# Patient Record
Sex: Male | Born: 1940
Health system: Southern US, Community
[De-identification: ages and names within clinical notes are randomized; demographics above are authoritative.]

## PROBLEM LIST (undated history)

## (undated) DIAGNOSIS — Z8669 Personal history of other diseases of the nervous system and sense organs: Secondary | ICD-10-CM

## (undated) DIAGNOSIS — M1990 Unspecified osteoarthritis, unspecified site: Secondary | ICD-10-CM

## (undated) DIAGNOSIS — T7840XA Allergy, unspecified, initial encounter: Secondary | ICD-10-CM

## (undated) DIAGNOSIS — C801 Malignant (primary) neoplasm, unspecified: Secondary | ICD-10-CM

## (undated) DIAGNOSIS — H269 Unspecified cataract: Secondary | ICD-10-CM

## (undated) DIAGNOSIS — I10 Essential (primary) hypertension: Secondary | ICD-10-CM

## (undated) DIAGNOSIS — H409 Unspecified glaucoma: Secondary | ICD-10-CM

## (undated) HISTORY — PX: COLONOSCOPY: SHX174

## (undated) HISTORY — DX: Unspecified osteoarthritis, unspecified site: M19.90

## (undated) HISTORY — DX: Malignant (primary) neoplasm, unspecified: C80.1

## (undated) HISTORY — DX: Allergy, unspecified, initial encounter: T78.40XA

## (undated) HISTORY — DX: Personal history of other diseases of the nervous system and sense organs: Z86.69

## (undated) HISTORY — DX: Unspecified cataract: H26.9

## (undated) HISTORY — PX: OTHER SURGICAL HISTORY: SHX169

## (undated) HISTORY — DX: Essential (primary) hypertension: I10

## (undated) HISTORY — PX: POLYPECTOMY: SHX149

## (undated) HISTORY — PX: UPPER GASTROINTESTINAL ENDOSCOPY: SHX188

## (undated) HISTORY — DX: Unspecified glaucoma: H40.9

---

## 1998-08-27 HISTORY — PX: VEIN LIGATION: SHX2652

## 2006-08-27 HISTORY — PX: PANCREAS SURGERY: SHX731

## 2006-12-28 ENCOUNTER — Emergency Department (HOSPITAL_COMMUNITY): Admission: EM | Admit: 2006-12-28 | Discharge: 2006-12-28 | Payer: Self-pay | Admitting: Emergency Medicine

## 2007-01-23 ENCOUNTER — Encounter: Payer: Self-pay | Admitting: Gastroenterology

## 2007-01-23 ENCOUNTER — Ambulatory Visit (HOSPITAL_COMMUNITY): Admission: RE | Admit: 2007-01-23 | Discharge: 2007-01-23 | Payer: Self-pay | Admitting: Gastroenterology

## 2007-01-27 ENCOUNTER — Encounter: Payer: Self-pay | Admitting: Gastroenterology

## 2007-01-28 ENCOUNTER — Ambulatory Visit: Payer: Self-pay | Admitting: Gastroenterology

## 2007-02-21 ENCOUNTER — Encounter: Payer: Self-pay | Admitting: Gastroenterology

## 2007-02-26 ENCOUNTER — Encounter: Payer: Self-pay | Admitting: Gastroenterology

## 2007-03-31 ENCOUNTER — Encounter: Payer: Self-pay | Admitting: Gastroenterology

## 2008-01-08 ENCOUNTER — Encounter: Admission: RE | Admit: 2008-01-08 | Discharge: 2008-01-08 | Payer: Self-pay | Admitting: Orthopaedic Surgery

## 2008-06-08 ENCOUNTER — Ambulatory Visit: Payer: Self-pay | Admitting: Gastroenterology

## 2008-06-16 ENCOUNTER — Ambulatory Visit: Payer: Self-pay | Admitting: Gastroenterology

## 2008-06-16 ENCOUNTER — Encounter: Payer: Self-pay | Admitting: Gastroenterology

## 2008-06-17 ENCOUNTER — Encounter: Payer: Self-pay | Admitting: Gastroenterology

## 2009-01-27 ENCOUNTER — Encounter: Admission: RE | Admit: 2009-01-27 | Discharge: 2009-01-27 | Payer: Self-pay | Admitting: Internal Medicine

## 2009-10-25 DIAGNOSIS — N401 Enlarged prostate with lower urinary tract symptoms: Secondary | ICD-10-CM | POA: Insufficient documentation

## 2009-10-25 DIAGNOSIS — L309 Dermatitis, unspecified: Secondary | ICD-10-CM | POA: Insufficient documentation

## 2009-10-25 DIAGNOSIS — G43909 Migraine, unspecified, not intractable, without status migrainosus: Secondary | ICD-10-CM | POA: Insufficient documentation

## 2009-10-25 DIAGNOSIS — M1990 Unspecified osteoarthritis, unspecified site: Secondary | ICD-10-CM | POA: Insufficient documentation

## 2009-10-25 DIAGNOSIS — I10 Essential (primary) hypertension: Secondary | ICD-10-CM | POA: Insufficient documentation

## 2009-11-28 DIAGNOSIS — K869 Disease of pancreas, unspecified: Secondary | ICD-10-CM | POA: Insufficient documentation

## 2009-11-28 DIAGNOSIS — R739 Hyperglycemia, unspecified: Secondary | ICD-10-CM | POA: Insufficient documentation

## 2010-01-31 ENCOUNTER — Encounter: Admission: RE | Admit: 2010-01-31 | Discharge: 2010-01-31 | Payer: Self-pay | Admitting: Internal Medicine

## 2010-11-30 DIAGNOSIS — H3530 Unspecified macular degeneration: Secondary | ICD-10-CM | POA: Insufficient documentation

## 2011-01-26 ENCOUNTER — Other Ambulatory Visit: Payer: Self-pay | Admitting: Internal Medicine

## 2011-01-26 DIAGNOSIS — K869 Disease of pancreas, unspecified: Secondary | ICD-10-CM

## 2011-02-06 ENCOUNTER — Ambulatory Visit
Admission: RE | Admit: 2011-02-06 | Discharge: 2011-02-06 | Disposition: A | Discharge status: Home / Self Care | Payer: BC Managed Care – PPO | Source: Ambulatory Visit | Attending: Internal Medicine | Admitting: Internal Medicine

## 2011-02-06 DIAGNOSIS — K869 Disease of pancreas, unspecified: Secondary | ICD-10-CM

## 2011-02-06 MED ORDER — IOHEXOL 300 MG/ML  SOLN
100.0000 mL | Freq: Once | INTRAMUSCULAR | Status: AC | PRN
  Administered 2011-02-06: 100 mL via INTRAVENOUS

## 2011-12-05 ENCOUNTER — Other Ambulatory Visit: Payer: Self-pay | Admitting: Internal Medicine

## 2011-12-05 DIAGNOSIS — K869 Disease of pancreas, unspecified: Secondary | ICD-10-CM

## 2011-12-19 ENCOUNTER — Other Ambulatory Visit: Payer: Self-pay | Admitting: Dermatology

## 2012-02-07 ENCOUNTER — Ambulatory Visit
Admission: RE | Admit: 2012-02-07 | Discharge: 2012-02-07 | Disposition: A | Discharge status: Home / Self Care | Payer: BC Managed Care – PPO | Source: Ambulatory Visit | Attending: Internal Medicine | Admitting: Internal Medicine

## 2012-02-07 DIAGNOSIS — K869 Disease of pancreas, unspecified: Secondary | ICD-10-CM

## 2012-02-07 MED ORDER — IOHEXOL 300 MG/ML  SOLN
100.0000 mL | Freq: Once | INTRAMUSCULAR | Status: AC | PRN
  Administered 2012-02-07: 100 mL via INTRAVENOUS

## 2012-06-24 DIAGNOSIS — Z2839 Other underimmunization status: Secondary | ICD-10-CM | POA: Insufficient documentation

## 2012-07-17 ENCOUNTER — Other Ambulatory Visit: Payer: Self-pay | Admitting: Dermatology

## 2012-12-19 DIAGNOSIS — J309 Allergic rhinitis, unspecified: Secondary | ICD-10-CM | POA: Insufficient documentation

## 2013-04-14 ENCOUNTER — Encounter: Payer: Self-pay | Admitting: Gastroenterology

## 2013-06-26 ENCOUNTER — Ambulatory Visit (AMBULATORY_SURGERY_CENTER): Payer: Self-pay | Admitting: *Deleted

## 2013-06-26 VITALS — Ht 70.0 in | Wt 156.2 lb

## 2013-06-26 DIAGNOSIS — Z86010 Personal history of colonic polyps: Secondary | ICD-10-CM

## 2013-06-26 MED ORDER — MOVIPREP 100 G PO SOLR: ORAL | Status: DC

## 2013-06-26 NOTE — Progress Notes (Signed)
No allergies to eggs or soy. No problems with anesthesia.  

## 2013-06-29 ENCOUNTER — Encounter: Payer: Self-pay | Admitting: Gastroenterology

## 2013-07-10 ENCOUNTER — Encounter: Payer: Self-pay | Admitting: Gastroenterology

## 2013-07-10 ENCOUNTER — Ambulatory Visit (AMBULATORY_SURGERY_CENTER): Payer: BC Managed Care – PPO | Admitting: Gastroenterology

## 2013-07-10 VITALS — BP 146/78 | HR 56 | Temp 97.6°F | Resp 24

## 2013-07-10 DIAGNOSIS — Z800 Family history of malignant neoplasm of digestive organs: Secondary | ICD-10-CM

## 2013-07-10 DIAGNOSIS — D126 Benign neoplasm of colon, unspecified: Secondary | ICD-10-CM

## 2013-07-10 DIAGNOSIS — Z86010 Personal history of colonic polyps: Secondary | ICD-10-CM

## 2013-07-10 MED ORDER — SODIUM CHLORIDE 0.9 % IV SOLN: 500.0000 mL | INTRAVENOUS | Status: DC

## 2013-07-10 NOTE — Progress Notes (Signed)
Patient did not experience any of the following events: a burn prior to discharge; a fall within the facility; wrong site/side/patient/procedure/implant event; or a hospital transfer or hospital admission upon discharge from the facility. (G8907) Patient did not have preoperative order for IV antibiotic SSI prophylaxis. (G8918)  

## 2013-07-10 NOTE — Op Note (Signed)
[facility] [address] [place], [address]   COLONOSCOPY PROCEDURE REPORT  PATIENT: Roy Moody, Roy Moody  MR#: [id number] BIRTHDATE: 07/01/1941 , 72  yrs. old GENDER: Male ENDOSCOPIST: Rachael Fee, MD PROCEDURE DATE:  07/10/2013 PROCEDURE:   Colonoscopy with snare polypectomy First Screening Colonoscopy - Avg.  risk and is 50 yrs.  old or older - No.  Prior Negative Screening - Now for repeat screening. N/A  History of Adenoma - Now for follow-up colonoscopy & has been > or = to 3 yrs.  Yes hx of adenoma.  Has been 3 or more years since last colonoscopy.  Polyps Removed Today? Yes. ASA CLASS:   Class II INDICATIONS:personal history adenomas (last colonscopy 5 years ago), father had colon cancer. MEDICATIONS: Fentanyl 50 mcg IV, Versed 8 mg IV, and These medications were titrated to patient response per physician's verbal order  DESCRIPTION OF PROCEDURE:   After the risks benefits and alternatives of the procedure were thoroughly explained, informed consent was obtained.  A digital rectal exam revealed no abnormalities of the rectum.   The LB [id number]  endoscope was introduced through the anus and advanced to the cecum, which was identified by both the appendix and ileocecal valve. No adverse events experienced.   The quality of the prep was good.  The instrument was then slowly withdrawn as the colon was fully examined.  COLON FINDINGS: Two polyps were found, removed and sent to pathology.  These were both sessile, located at appendiceal orifice and ascending segment, 4-80mm across, removed with cold snare.  The examination was otherwise normal.  Retroflexed views revealed no abnormalities. The time to cecum=5 minutes 32 seconds.  Withdrawal time=10 minutes 26 seconds.  The scope was withdrawn and the procedure completed. COMPLICATIONS: There were no complications.  ENDOSCOPIC IMPRESSION: Two polyps were found, removed and sent to  pathology. The examination was otherwise normal.  RECOMMENDATIONS: If the polyp(s) removed today are proven to be adenomatous (pre-cancerous) polyps, you will need a repeat colonoscopy in 5 years.  You will receive a letter within 1-2 weeks with the results of your biopsy as well as final recommendations.  Please call my office if you have not received a letter after 3 weeks.   eSigned:  Rachael Fee, MD 07/10/2013 12:07 PM   cc: Creola Corn, MD

## 2013-07-10 NOTE — Patient Instructions (Signed)
Discharge instructions given with verbal understanding. Handout on polyps. Resume previous medications. YOU HAD AN ENDOSCOPIC PROCEDURE TODAY AT THE [facility]: Refer to the procedure report that was given to you for any specific questions about what was found during the examination.  If the procedure report does not answer your questions, please call your gastroenterologist to clarify.  If you requested that your care partner not be given the details of your procedure findings, then the procedure report has been included in a sealed envelope for you to review at your convenience later.  YOU SHOULD EXPECT: Some feelings of bloating in the abdomen. Passage of more gas than usual.  Walking can help get rid of the air that was put into your GI tract during the procedure and reduce the bloating. If you had a lower endoscopy (such as a colonoscopy or flexible sigmoidoscopy) you may notice spotting of blood in your stool or on the toilet paper. If you underwent a bowel prep for your procedure, then you may not have a normal bowel movement for a few days.  DIET: Your first meal following the procedure should be a light meal and then it is ok to progress to your normal diet.  A half-sandwich or bowl of soup is an example of a good first meal.  Heavy or fried foods are harder to digest and may make you feel nauseous or bloated.  Likewise meals heavy in dairy and vegetables can cause extra gas to form and this can also increase the bloating.  Drink plenty of fluids but you should avoid alcoholic beverages for 24 hours.  ACTIVITY: Your care partner should take you home directly after the procedure.  You should plan to take it easy, moving slowly for the rest of the day.  You can resume normal activity the day after the procedure however you should NOT DRIVE or use heavy machinery for 24 hours (because of the sedation medicines used during the test).    SYMPTOMS TO REPORT IMMEDIATELY: A  gastroenterologist can be reached at any hour.  During normal business hours, 8:30 AM to 5:00 PM Monday through Friday, call [phone].  After hours and on weekends, please call the GI answering service at [phone] who will take a message and have the physician on call contact you.   Following lower endoscopy (colonoscopy or flexible sigmoidoscopy):  Excessive amounts of blood in the stool  Significant tenderness or worsening of abdominal pains  Swelling of the abdomen that is new, acute  Fever of 100F or higher  FOLLOW UP: If any biopsies were taken you will be contacted by phone or by letter within the next 1-3 weeks.  Call your gastroenterologist if you have not heard about the biopsies in 3 weeks.  Our staff will call the home number listed on your records the next business day following your procedure to check on you and address any questions or concerns that you may have at that time regarding the information given to you following your procedure. This is a courtesy call and so if there is no answer at the home number and we have not heard from you through the emergency physician on call, we will assume that you have returned to your regular daily activities without incident.  SIGNATURES/CONFIDENTIALITY: You and/or your care partner have signed paperwork which will be entered into your electronic medical record.  These signatures attest to the fact that that the information above on your After Visit Summary has been   reviewed and is understood.  Full responsibility of the confidentiality of this discharge information lies with you and/or your care-partner. 

## 2013-07-13 ENCOUNTER — Telehealth: Payer: Self-pay

## 2013-07-13 NOTE — Telephone Encounter (Signed)
Left message on answering machine. 

## 2013-07-15 ENCOUNTER — Encounter: Payer: Self-pay | Admitting: Gastroenterology

## 2014-07-08 ENCOUNTER — Other Ambulatory Visit: Payer: Self-pay | Admitting: Dermatology

## 2014-12-23 DIAGNOSIS — H409 Unspecified glaucoma: Secondary | ICD-10-CM | POA: Insufficient documentation

## 2014-12-23 DIAGNOSIS — R809 Proteinuria, unspecified: Secondary | ICD-10-CM | POA: Insufficient documentation

## 2015-03-10 ENCOUNTER — Encounter: Payer: Self-pay | Admitting: Gastroenterology

## 2016-07-03 DIAGNOSIS — R319 Hematuria, unspecified: Secondary | ICD-10-CM | POA: Insufficient documentation

## 2016-12-24 DIAGNOSIS — K4090 Unilateral inguinal hernia, without obstruction or gangrene, not specified as recurrent: Secondary | ICD-10-CM | POA: Insufficient documentation

## 2017-07-11 DIAGNOSIS — I8390 Asymptomatic varicose veins of unspecified lower extremity: Secondary | ICD-10-CM | POA: Insufficient documentation

## 2017-07-11 DIAGNOSIS — C4491 Basal cell carcinoma of skin, unspecified: Secondary | ICD-10-CM | POA: Insufficient documentation

## 2018-05-19 ENCOUNTER — Encounter: Payer: Self-pay | Admitting: Gastroenterology

## 2018-07-01 ENCOUNTER — Ambulatory Visit (AMBULATORY_SURGERY_CENTER): Payer: Self-pay | Admitting: *Deleted

## 2018-07-01 ENCOUNTER — Encounter: Payer: Self-pay | Admitting: Gastroenterology

## 2018-07-01 VITALS — Ht 69.5 in | Wt 153.6 lb

## 2018-07-01 DIAGNOSIS — Z86010 Personal history of colonic polyps: Secondary | ICD-10-CM

## 2018-07-01 DIAGNOSIS — Z800 Family history of malignant neoplasm of digestive organs: Secondary | ICD-10-CM

## 2018-07-01 MED ORDER — NA SULFATE-K SULFATE-MG SULF 17.5-3.13-1.6 GM/177ML PO SOLN: 1.0000 | Freq: Once | ORAL | 0 refills | Status: AC

## 2018-07-01 MED ORDER — PEG 3350-KCL-NA BICARB-NACL 420 G PO SOLR: 4000.0000 mL | Freq: Once | ORAL | 0 refills | Status: AC

## 2018-07-01 NOTE — Progress Notes (Signed)
No egg or soy allergy known to patient  No issues with past sedation with any surgeries  or procedures, no intubation problems  No diet pills per patient No home 02 use per patient  No blood thinners per patient  Pt denies issues with constipation  No A fib or A flutter  EMMI video sent to pt's e mail - pt declined  Pt states h will not drink Golytely- he asked for mag citrtae- we discussed all preps and prices and he decided to use Suprep and will pay- I explained may be 100- He was ok with that - suprep sent ot gate [facility]

## 2018-07-15 ENCOUNTER — Ambulatory Visit (AMBULATORY_SURGERY_CENTER): Payer: Medicare Other | Admitting: Gastroenterology

## 2018-07-15 ENCOUNTER — Encounter: Payer: Self-pay | Admitting: Gastroenterology

## 2018-07-15 VITALS — BP 148/72 | HR 60 | Temp 96.6°F | Resp 16 | Ht 69.0 in | Wt 153.0 lb

## 2018-07-15 DIAGNOSIS — D128 Benign neoplasm of rectum: Secondary | ICD-10-CM

## 2018-07-15 DIAGNOSIS — D122 Benign neoplasm of ascending colon: Secondary | ICD-10-CM

## 2018-07-15 DIAGNOSIS — D129 Benign neoplasm of anus and anal canal: Secondary | ICD-10-CM | POA: Diagnosis not present

## 2018-07-15 DIAGNOSIS — D125 Benign neoplasm of sigmoid colon: Secondary | ICD-10-CM

## 2018-07-15 DIAGNOSIS — Z800 Family history of malignant neoplasm of digestive organs: Secondary | ICD-10-CM

## 2018-07-15 DIAGNOSIS — D124 Benign neoplasm of descending colon: Secondary | ICD-10-CM

## 2018-07-15 DIAGNOSIS — Z86010 Personal history of colonic polyps: Secondary | ICD-10-CM

## 2018-07-15 DIAGNOSIS — D120 Benign neoplasm of cecum: Secondary | ICD-10-CM | POA: Diagnosis not present

## 2018-07-15 DIAGNOSIS — D123 Benign neoplasm of transverse colon: Secondary | ICD-10-CM | POA: Diagnosis not present

## 2018-07-15 DIAGNOSIS — K635 Polyp of colon: Secondary | ICD-10-CM

## 2018-07-15 MED ORDER — SODIUM CHLORIDE 0.9 % IV SOLN: 500.0000 mL | Freq: Once | INTRAVENOUS | Status: DC

## 2018-07-15 NOTE — Patient Instructions (Signed)
  Thank you for allowing [place] to care for you today!  Await pathology results be mail, approximately 2 weeks.  Recommendations for next colonoscopy will be made at that time.  Resume previous diet and medications today.  Return to normal activities tomorrow.   YOU HAD AN ENDOSCOPIC PROCEDURE TODAY AT [facility]:   Refer to the procedure report that was given to you for any specific questions about what was found during the examination.  If the procedure report does not answer your questions, please call your gastroenterologist to clarify.  If you requested that your care partner not be given the details of your procedure findings, then the procedure report has been included in a sealed envelope for you to review at your convenience later.  YOU SHOULD EXPECT: Some feelings of bloating in the abdomen. Passage of more gas than usual.  Walking can help get rid of the air that was put into your GI tract during the procedure and reduce the bloating. If you had a lower endoscopy (such as a colonoscopy or flexible sigmoidoscopy) you may notice spotting of blood in your stool or on the toilet paper. If you underwent a bowel prep for your procedure, you may not have a normal bowel movement for a few days.  Please Note:  You might notice some irritation and congestion in your nose or some drainage.  This is from the oxygen used during your procedure.  There is no need for concern and it should clear up in a day or so.  SYMPTOMS TO REPORT IMMEDIATELY:   Following lower endoscopy (colonoscopy or flexible sigmoidoscopy):  Excessive amounts of blood in the stool  Significant tenderness or worsening of abdominal pains  Swelling of the abdomen that is new, acute  Fever of 100F or higher  For urgent or emergent issues, a gastroenterologist can be reached at any hour by calling [phone].   DIET:  We do recommend a small meal at first, but then you may proceed to your regular diet.   Drink plenty of fluids but you should avoid alcoholic beverages for 24 hours.  ACTIVITY:  You should plan to take it easy for the rest of today and you should NOT DRIVE or use heavy machinery until tomorrow (because of the sedation medicines used during the test).    FOLLOW UP: Our staff will call the number listed on your records the next business day following your procedure to check on you and address any questions or concerns that you may have regarding the information given to you following your procedure. If we do not reach you, we will leave a message.  However, if you are feeling well and you are not experiencing any problems, there is no need to return our call.  We will assume that you have returned to your regular daily activities without incident.  If any biopsies were taken you will be contacted by phone or by letter within the next 1-3 weeks.  Please call us at [phone] if you have not heard about the biopsies in 3 weeks.    SIGNATURES/CONFIDENTIALITY: You and/or your care partner have signed paperwork which will be entered into your electronic medical record.  These signatures attest to the fact that that the information above on your After Visit Summary has been reviewed and is understood.  Full responsibility of the confidentiality of this discharge information lies with you and/or your care-partner.

## 2018-07-15 NOTE — Progress Notes (Signed)
To PACU, VSS. Report to [email or web address] 

## 2018-07-15 NOTE — Op Note (Addendum)
[facility] Patient Name: Roy Moody Procedure Date: 07/15/2018 9:04 AM MRN: [id number] Endoscopist: Milus Banister , MD Age: 76 Referring MD:  Date of Birth: July 20, 1941 Gender: Male Account #: [id number] Procedure:                Colonoscopy Indications:              High risk colon cancer surveillance: Personal                            history of colonic polyps; colonoscopy 2014 Dr.                            Ardis Hughs found two subCM polyps (TA and SSA), father                            died of colon cancer in his 35s Medicines:                Monitored Anesthesia Care Procedure:                Pre-Anesthesia Assessment:                           - Prior to the procedure, a History and Physical                            was performed, and patient medications and                            allergies were reviewed. The patient's tolerance of                            previous anesthesia was also reviewed. The risks                            and benefits of the procedure and the sedation                            options and risks were discussed with the patient.                            All questions were answered, and informed consent                            was obtained. Prior Anticoagulants: The patient has                            taken no previous anticoagulant or antiplatelet                            agents. ASA Grade Assessment: II - A patient with                            mild systemic disease. After reviewing the risks  and benefits, the patient was deemed in                            satisfactory condition to undergo the procedure.                           After obtaining informed consent, the colonoscope                            was passed under direct vision. Throughout the                            procedure, the patient's blood pressure, pulse, and                            oxygen saturations were  monitored continuously. The                            Colonoscope was introduced through the anus and                            advanced to the the cecum, identified by                            appendiceal orifice and ileocecal valve. The                            colonoscopy was performed without difficulty. The                            patient tolerated the procedure well. The quality                            of the bowel preparation was good. The ileocecal                            valve, appendiceal orifice, and rectum were                            photographed. Scope In: 9:10:29 AM Scope Out: 9:23:39 AM Scope Withdrawal Time: 0 hours 9 minutes 35 seconds  Total Procedure Duration: 0 hours 13 minutes 10 seconds  Findings:                 Eight sessile polyps were found in the sigmoid                            colon, descending colon, transverse colon,                            ascending colon and appendiceal orifice. The polyps                            were 2 to 6 mm in size. These polyps were removed  with a cold snare. Resection and retrieval were                            complete.                           The exam was otherwise without abnormality on                            direct and retroflexion views. Complications:            No immediate complications. Estimated blood loss:                            None. Estimated Blood Loss:     Estimated blood loss: none. Impression:               - Eight 2 to 6 mm polyps in the sigmoid colon, in                            the descending colon, in the transverse colon, in                            the ascending colon and at the appendiceal orifice,                            removed with a cold snare. Resected and retrieved.                           - The examination was otherwise normal on direct                            and retroflexion views. Recommendation:           - Patient has a  contact number available for                            emergencies. The signs and symptoms of potential                            delayed complications were discussed with the                            patient. Return to normal activities tomorrow.                            Written discharge instructions were provided to the                            patient.                           - Resume previous diet.                           - Continue present medications.  You will receive a letter within 2-3 weeks with the                            pathology results and my final recommendations.                           If the polyp(s) is proven to be 'pre-cancerous' on                            pathology, you will need repeat colonoscopy in 3                            years. Milus Banister, MD 07/15/2018 9:27:07 AM This report has been signed electronically.

## 2018-07-15 NOTE — Progress Notes (Signed)
Called to room to assist during endoscopic procedure.  Patient ID and intended procedure confirmed with present staff. Received instructions for my participation in the procedure from the performing physician.  

## 2018-07-16 ENCOUNTER — Telehealth: Payer: Self-pay

## 2018-07-16 NOTE — Telephone Encounter (Signed)
  Follow up Call-  Call back number 07/15/2018  Post procedure Call Back phone  # [phone]  Permission to leave phone message Yes  Some recent data might be hidden     Patient questions:  Do you have a fever, pain , or abdominal swelling? No. Pain Score  0 *  Have you tolerated food without any problems? Yes.    Have you been able to return to your normal activities? Yes.    Do you have any questions about your discharge instructions: Diet   No. Medications  No. Follow up visit  No.  Do you have questions or concerns about your Care? No.  Actions: * If pain score is 4 or above: No action needed, pain <4.

## 2018-07-17 DIAGNOSIS — K635 Polyp of colon: Secondary | ICD-10-CM | POA: Insufficient documentation

## 2018-07-20 ENCOUNTER — Encounter: Payer: Self-pay | Admitting: Gastroenterology

## 2018-09-23 ENCOUNTER — Encounter (INDEPENDENT_AMBULATORY_CARE_PROVIDER_SITE_OTHER): Payer: Self-pay | Admitting: Orthopaedic Surgery

## 2018-09-23 ENCOUNTER — Ambulatory Visit (INDEPENDENT_AMBULATORY_CARE_PROVIDER_SITE_OTHER): Payer: Medicare Other | Admitting: Orthopaedic Surgery

## 2018-09-23 ENCOUNTER — Ambulatory Visit (INDEPENDENT_AMBULATORY_CARE_PROVIDER_SITE_OTHER): Payer: Medicare Other

## 2018-09-23 VITALS — BP 129/81 | Ht 69.5 in | Wt 155.0 lb

## 2018-09-23 DIAGNOSIS — M5441 Lumbago with sciatica, right side: Secondary | ICD-10-CM

## 2018-09-23 DIAGNOSIS — M79604 Pain in right leg: Secondary | ICD-10-CM | POA: Diagnosis not present

## 2018-09-23 DIAGNOSIS — G8929 Other chronic pain: Secondary | ICD-10-CM | POA: Diagnosis not present

## 2018-09-23 NOTE — Progress Notes (Signed)
Office Visit Note   Patient: Roy Moody           Date of Birth: 1941/07/30           MRN: [id number] Visit Date: 09/23/2018              Requested by: Shon Baton, [address], [place] [id number] PCP: Shon Baton, MD   Assessment & Plan: Visit Diagnoses:  1. Pain in right leg   2. Chronic right-sided low back pain with right-sided sciatica     Plan: Has been experiencing lateral right leg pain for approximately the 6 to 7 weeks.  No injury or trauma.  I am not sure if this is referred from the lateral joint right knee or possibly from his back.  X-rays demonstrate CPPD but without joint space narrowing in the right knee.  Has had prior MRI scan with evidence of degenerative arthrosis in 2014 in the lumbar spine.  Long discussion regarding diagnostic possibilities and treatment options including giving it more time, injecting the knee as a diagnostic and therapeutic maneuver or considering further evaluation of the lumbar spine.  Roy Moody relates that he would like to give it a little bit more time as he feels like it is better.  I do not think there is anything of significance based on exam that would require immediate evaluation  Follow-Up Instructions: Return if symptoms worsen or fail to improve.   Orders:  Orders Placed This Encounter  Procedures  . XR Tibia/Fibula Right  . Ambulatory referral to Physical Medicine Rehab   No orders of the defined types were placed in this encounter.     Procedures: No procedures performed   Clinical Data: No additional findings.   Subjective: Chief Complaint  Patient presents with  . Right Leg - Pain  . Leg Pain    having leg pain below the knee , having pain with standing. started in dec 2019. no injury , using advil   Roy Moody is 78 years old and visited the office evaluation of right leg pain.  He noted insidious onset about 6 to 7 years ago without history of injury or trauma.  He does walk up to  2-1/2 miles a day and has not had any compromise of his activities.  The pain is localized to the lateral aspect of his right leg.  There is no specific knee or ankle pain or foot discomfort.  There is no particular pattern.  He has had an issue with his back in the past but presently does not have any discomfort.  He did have an MRI scan of his lumbar spine in 2014 revealing degenerative changes and some facet arthropathy and possibly foraminal stenosis but predominately on the left.  He is not having any left leg pain.  He is not had any knee swelling or giving way.  He has had very little trouble at night.  Seems to be more of a problem when he sits for a length of time and then gets up or when he bends forward and extends his back he will experience some discomfort in his leg.  No bowel or bladder changes  HPI  Review of Systems   Objective: Vital Signs: BP 129/81   Ht 5' 9.5" (1.765 m)   Wt 155 lb (70.3 kg)   BMI 22.56 kg/m   Physical Exam Constitutional:      Appearance: He is well-developed.  Eyes:     Pupils: Pupils  are equal, round, and reactive to light.  Pulmonary:     Effort: Pulmonary effort is normal.  Skin:    General: Skin is warm and dry.  Neurological:     Mental Status: He is alert and oriented to person, place, and time.  Psychiatric:        Behavior: Behavior normal.     Ortho Exam awake alert and oriented x3.  Comfortable sitting.  Right knee was benign by exam.  No effusion.  No significant joint tenderness.  Full flexion-extension.  Little bit of patella crepitation but no catching or pain.  No instability.  No popliteal pain.  No calf discomfort.  No pain along the lateral aspect of the leg between the knee and the ankle.  No edema.  Neurologically intact.  No Tinel's over the peroneal nerve.  Straight leg raise negative.  Painless range of motion of right and left hip.  Specialty Comments:  No specialty comments available.  Imaging: Xr Tibia/fibula  Right  Result Date: 09/23/2018 Films of the right tibia and fibula including the knee joint were obtained in the standing projection.  There is evidence of CPPD with calcification of the menisci.  Joint spaces are well-maintained.  No bony abnormality either of the right fibula or tibia.    PMFS History: There are no active problems to display for this patient.  Past Medical History:  Diagnosis Date  . Allergy    contact allergens  . Arthritis   . Cancer ([place])    skin cancer  . Cataract    removed   . Glaucoma   . History of migraine headaches   . Hypertension     Family History  Problem Relation Age of Onset  . Colon cancer Father 57  . Colon polyps Neg Hx   . Esophageal cancer Neg Hx   . Rectal cancer Neg Hx   . Stomach cancer Neg Hx     Past Surgical History:  Procedure Laterality Date  . COLONOSCOPY    . PANCREAS SURGERY  2008  . POLYPECTOMY    . skin cancer removal     several  . UPPER GASTROINTESTINAL ENDOSCOPY    . VEIN LIGATION Right 2000   leg   Social History   Occupational History  . Not on file  Tobacco Use  . Smoking status: Never Smoker  . Smokeless tobacco: Never Used  Substance and Sexual Activity  . Alcohol use: Yes    Alcohol/week: 7.0 standard drinks    Types: 7 Glasses of wine per week  . Drug use: No  . Sexual activity: Not on file     Garald Balding, MD   Note - This record has been created using [employer].  Chart creation errors have been sought, but may not always  have been located. Such creation errors do not reflect on  the standard of medical care.

## 2018-10-09 ENCOUNTER — Ambulatory Visit (INDEPENDENT_AMBULATORY_CARE_PROVIDER_SITE_OTHER): Payer: Self-pay

## 2018-10-09 ENCOUNTER — Encounter (INDEPENDENT_AMBULATORY_CARE_PROVIDER_SITE_OTHER): Payer: Self-pay | Admitting: Orthopaedic Surgery

## 2018-10-09 ENCOUNTER — Ambulatory Visit (INDEPENDENT_AMBULATORY_CARE_PROVIDER_SITE_OTHER): Payer: Medicare Other | Admitting: Orthopaedic Surgery

## 2018-10-09 VITALS — BP 118/68 | HR 61 | Ht 69.5 in | Wt 155.0 lb

## 2018-10-09 DIAGNOSIS — M25562 Pain in left knee: Secondary | ICD-10-CM

## 2018-10-09 MED ORDER — LIDOCAINE HCL 1 % IJ SOLN
2.0000 mL | INTRAMUSCULAR | Status: AC | PRN
  Administered 2018-10-09: 2 mL

## 2018-10-09 MED ORDER — METHYLPREDNISOLONE ACETATE 40 MG/ML IJ SUSP
80.0000 mg | INTRAMUSCULAR | Status: AC | PRN
  Administered 2018-10-09: 80 mg via INTRA_ARTICULAR

## 2018-10-09 MED ORDER — BUPIVACAINE HCL 0.5 % IJ SOLN
2.0000 mL | INTRAMUSCULAR | Status: AC | PRN
  Administered 2018-10-09: 2 mL via INTRA_ARTICULAR

## 2018-10-09 NOTE — Progress Notes (Signed)
Office Visit Note   Patient: Roy Moody           Date of Birth: 22-Jul-1941           MRN: [id number] Visit Date: 10/09/2018              Requested by: Shon Baton, [address] [place], [place] [address] PCP: Shon Baton, MD   Assessment & Plan: Visit Diagnoses:  1. Acute pain of left knee     Plan: Films of the left knee are consistent with pseudogout.  Will inject with cortisone and monitor response  Follow-Up Instructions: Return if symptoms worsen or fail to improve.   Orders:  Orders Placed This Encounter  Procedures  . Large Joint Inj: L knee  . XR KNEE 3 VIEW LEFT   No orders of the defined types were placed in this encounter.     Procedures: Large Joint Inj: L knee on 10/09/2018 1:42 PM Indications: pain and diagnostic evaluation Details: 25 G 1.5 in needle, anteromedial approach  Arthrogram: No  Medications: 2 mL lidocaine 1 %; 2 mL bupivacaine 0.5 %; 80 mg methylPREDNISolone acetate 40 MG/ML Procedure, treatment alternatives, risks and benefits explained, specific risks discussed. Consent was given by the patient. Patient was prepped and draped in the usual sterile fashion.       Clinical Data: No additional findings.   Subjective: Chief Complaint  Patient presents with  . Left Knee - Pain  Patient presents today with left knee pain. He said that it feels like it "exploded" 6 days ago.  He known injury that he is aware of, but he did do a lot of yard work two days before that. He points around his patella as the area of pain. No weakness, numbness, tingling, or swelling. He had an extremely hard time walking at first, but it has gotten better. He has been having a difficult time sleeping at night. He has tried Tylenol, Advil. And Hydrocodone but nothing seems to help much.  HPI  Review of Systems   Objective: Vital Signs: BP 118/68   Pulse 61   Ht 5' 9.5" (1.765 m)   Wt 155 lb (70.3 kg)   BMI 22.56 kg/m   Physical  Exam Constitutional:      Appearance: He is well-developed.  Eyes:     Pupils: Pupils are equal, round, and reactive to light.  Pulmonary:     Effort: Pulmonary effort is normal.  Skin:    General: Skin is warm and dry.  Neurological:     Mental Status: He is alert and oriented to person, place, and time.  Psychiatric:        Behavior: Behavior normal.     Ortho Exam awake alert and oriented x3.  Comfortable sitting.  Does not walk with a limp.  Has mild medial and lateral joint pain left knee.  No effusion.  No instability.  Full quick extension over 105 degrees of flexion.  No calf pain.  No popliteal fullness.  Neurovascular exam intact.  Specialty Comments:  No specialty comments available.  Imaging: Xr Knee 3 View Left  Result Date: 10/09/2018 Films of the left knee were obtained in several projections standing.  There is some ectopic calcification within the menisci probably consistent with CPPD.  No acute changes.  Joint spaces are well-maintained.  I did see any appreciable osteophytes.    PMFS History: There are no active problems to display for this patient.  Past Medical History:  Diagnosis Date  . Allergy    contact allergens  . Arthritis   . Cancer ([place])    skin cancer  . Cataract    removed   . Glaucoma   . History of migraine headaches   . Hypertension     Family History  Problem Relation Age of Onset  . Colon cancer Father 22  . Colon polyps Neg Hx   . Esophageal cancer Neg Hx   . Rectal cancer Neg Hx   . Stomach cancer Neg Hx     Past Surgical History:  Procedure Laterality Date  . COLONOSCOPY    . PANCREAS SURGERY  2008  . POLYPECTOMY    . skin cancer removal     several  . UPPER GASTROINTESTINAL ENDOSCOPY    . VEIN LIGATION Right 2000   leg   Social History   Occupational History  . Not on file  Tobacco Use  . Smoking status: Never Smoker  . Smokeless tobacco: Never Used  Substance and Sexual Activity  . Alcohol use: Yes     Alcohol/week: 7.0 standard drinks    Types: 7 Glasses of wine per week  . Drug use: No  . Sexual activity: Not on file

## 2019-01-08 DIAGNOSIS — Z1389 Encounter for screening for other disorder: Secondary | ICD-10-CM | POA: Insufficient documentation

## 2019-01-08 DIAGNOSIS — D179 Benign lipomatous neoplasm, unspecified: Secondary | ICD-10-CM | POA: Insufficient documentation

## 2020-03-02 DIAGNOSIS — H00025 Hordeolum internum left lower eyelid: Secondary | ICD-10-CM | POA: Diagnosis not present

## 2020-03-02 DIAGNOSIS — H0102B Squamous blepharitis left eye, upper and lower eyelids: Secondary | ICD-10-CM | POA: Diagnosis not present

## 2020-03-02 DIAGNOSIS — H0102A Squamous blepharitis right eye, upper and lower eyelids: Secondary | ICD-10-CM | POA: Diagnosis not present

## 2020-04-08 DIAGNOSIS — Z20822 Contact with and (suspected) exposure to covid-19: Secondary | ICD-10-CM | POA: Diagnosis not present

## 2020-04-11 DIAGNOSIS — L821 Other seborrheic keratosis: Secondary | ICD-10-CM | POA: Diagnosis not present

## 2020-04-11 DIAGNOSIS — D1801 Hemangioma of skin and subcutaneous tissue: Secondary | ICD-10-CM | POA: Diagnosis not present

## 2020-04-11 DIAGNOSIS — Z85828 Personal history of other malignant neoplasm of skin: Secondary | ICD-10-CM | POA: Diagnosis not present

## 2020-04-11 DIAGNOSIS — L309 Dermatitis, unspecified: Secondary | ICD-10-CM | POA: Diagnosis not present

## 2020-04-11 DIAGNOSIS — L570 Actinic keratosis: Secondary | ICD-10-CM | POA: Diagnosis not present

## 2020-04-11 DIAGNOSIS — L918 Other hypertrophic disorders of the skin: Secondary | ICD-10-CM | POA: Diagnosis not present

## 2020-04-20 DIAGNOSIS — Z961 Presence of intraocular lens: Secondary | ICD-10-CM | POA: Diagnosis not present

## 2020-04-20 DIAGNOSIS — H5021 Vertical strabismus, right eye: Secondary | ICD-10-CM | POA: Diagnosis not present

## 2020-04-20 DIAGNOSIS — H401113 Primary open-angle glaucoma, right eye, severe stage: Secondary | ICD-10-CM | POA: Diagnosis not present

## 2020-04-20 DIAGNOSIS — H401121 Primary open-angle glaucoma, left eye, mild stage: Secondary | ICD-10-CM | POA: Diagnosis not present

## 2020-04-20 DIAGNOSIS — H2512 Age-related nuclear cataract, left eye: Secondary | ICD-10-CM | POA: Diagnosis not present

## 2020-06-18 DIAGNOSIS — Z23 Encounter for immunization: Secondary | ICD-10-CM | POA: Diagnosis not present

## 2020-07-15 DIAGNOSIS — I10 Essential (primary) hypertension: Secondary | ICD-10-CM | POA: Diagnosis not present

## 2020-07-15 DIAGNOSIS — I8390 Asymptomatic varicose veins of unspecified lower extremity: Secondary | ICD-10-CM | POA: Diagnosis not present

## 2020-07-15 DIAGNOSIS — N401 Enlarged prostate with lower urinary tract symptoms: Secondary | ICD-10-CM | POA: Diagnosis not present

## 2020-07-15 DIAGNOSIS — H3530 Unspecified macular degeneration: Secondary | ICD-10-CM | POA: Diagnosis not present

## 2020-07-15 DIAGNOSIS — C4491 Basal cell carcinoma of skin, unspecified: Secondary | ICD-10-CM | POA: Diagnosis not present

## 2020-07-15 DIAGNOSIS — J309 Allergic rhinitis, unspecified: Secondary | ICD-10-CM | POA: Diagnosis not present

## 2020-07-15 DIAGNOSIS — G43909 Migraine, unspecified, not intractable, without status migrainosus: Secondary | ICD-10-CM | POA: Diagnosis not present

## 2020-07-15 DIAGNOSIS — R739 Hyperglycemia, unspecified: Secondary | ICD-10-CM | POA: Diagnosis not present

## 2020-07-15 DIAGNOSIS — H409 Unspecified glaucoma: Secondary | ICD-10-CM | POA: Diagnosis not present

## 2020-10-12 DIAGNOSIS — C44319 Basal cell carcinoma of skin of other parts of face: Secondary | ICD-10-CM | POA: Diagnosis not present

## 2020-10-12 DIAGNOSIS — D2339 Other benign neoplasm of skin of other parts of face: Secondary | ICD-10-CM | POA: Diagnosis not present

## 2020-10-12 DIAGNOSIS — L720 Epidermal cyst: Secondary | ICD-10-CM | POA: Diagnosis not present

## 2020-10-12 DIAGNOSIS — L570 Actinic keratosis: Secondary | ICD-10-CM | POA: Diagnosis not present

## 2020-10-12 DIAGNOSIS — D485 Neoplasm of uncertain behavior of skin: Secondary | ICD-10-CM | POA: Diagnosis not present

## 2020-10-12 DIAGNOSIS — Z85828 Personal history of other malignant neoplasm of skin: Secondary | ICD-10-CM | POA: Diagnosis not present

## 2020-10-12 DIAGNOSIS — L821 Other seborrheic keratosis: Secondary | ICD-10-CM | POA: Diagnosis not present

## 2020-10-18 DIAGNOSIS — H0102A Squamous blepharitis right eye, upper and lower eyelids: Secondary | ICD-10-CM | POA: Diagnosis not present

## 2020-10-18 DIAGNOSIS — H0102B Squamous blepharitis left eye, upper and lower eyelids: Secondary | ICD-10-CM | POA: Diagnosis not present

## 2020-10-18 DIAGNOSIS — H401113 Primary open-angle glaucoma, right eye, severe stage: Secondary | ICD-10-CM | POA: Diagnosis not present

## 2020-10-18 DIAGNOSIS — Z9889 Other specified postprocedural states: Secondary | ICD-10-CM | POA: Diagnosis not present

## 2020-10-18 DIAGNOSIS — H5021 Vertical strabismus, right eye: Secondary | ICD-10-CM | POA: Diagnosis not present

## 2020-10-18 DIAGNOSIS — H401121 Primary open-angle glaucoma, left eye, mild stage: Secondary | ICD-10-CM | POA: Diagnosis not present

## 2020-10-18 DIAGNOSIS — H2512 Age-related nuclear cataract, left eye: Secondary | ICD-10-CM | POA: Diagnosis not present

## 2020-10-18 DIAGNOSIS — Z961 Presence of intraocular lens: Secondary | ICD-10-CM | POA: Diagnosis not present

## 2021-01-05 DIAGNOSIS — R739 Hyperglycemia, unspecified: Secondary | ICD-10-CM | POA: Diagnosis not present

## 2021-01-05 DIAGNOSIS — I10 Essential (primary) hypertension: Secondary | ICD-10-CM | POA: Diagnosis not present

## 2021-01-05 DIAGNOSIS — Z125 Encounter for screening for malignant neoplasm of prostate: Secondary | ICD-10-CM | POA: Diagnosis not present

## 2021-01-12 DIAGNOSIS — Z0000 Encounter for general adult medical examination without abnormal findings: Secondary | ICD-10-CM | POA: Diagnosis not present

## 2021-01-12 DIAGNOSIS — R82998 Other abnormal findings in urine: Secondary | ICD-10-CM | POA: Diagnosis not present

## 2021-01-12 DIAGNOSIS — N3281 Overactive bladder: Secondary | ICD-10-CM | POA: Diagnosis not present

## 2021-01-12 DIAGNOSIS — M1990 Unspecified osteoarthritis, unspecified site: Secondary | ICD-10-CM | POA: Diagnosis not present

## 2021-01-12 DIAGNOSIS — I8390 Asymptomatic varicose veins of unspecified lower extremity: Secondary | ICD-10-CM | POA: Diagnosis not present

## 2021-01-12 DIAGNOSIS — I10 Essential (primary) hypertension: Secondary | ICD-10-CM | POA: Diagnosis not present

## 2021-01-12 DIAGNOSIS — R739 Hyperglycemia, unspecified: Secondary | ICD-10-CM | POA: Diagnosis not present

## 2021-01-12 DIAGNOSIS — G43909 Migraine, unspecified, not intractable, without status migrainosus: Secondary | ICD-10-CM | POA: Diagnosis not present

## 2021-01-12 DIAGNOSIS — Z1331 Encounter for screening for depression: Secondary | ICD-10-CM | POA: Diagnosis not present

## 2021-01-12 DIAGNOSIS — N401 Enlarged prostate with lower urinary tract symptoms: Secondary | ICD-10-CM | POA: Diagnosis not present

## 2021-02-22 DIAGNOSIS — H903 Sensorineural hearing loss, bilateral: Secondary | ICD-10-CM | POA: Diagnosis not present

## 2021-04-12 DIAGNOSIS — L821 Other seborrheic keratosis: Secondary | ICD-10-CM | POA: Diagnosis not present

## 2021-04-12 DIAGNOSIS — D1801 Hemangioma of skin and subcutaneous tissue: Secondary | ICD-10-CM | POA: Diagnosis not present

## 2021-04-12 DIAGNOSIS — Z85828 Personal history of other malignant neoplasm of skin: Secondary | ICD-10-CM | POA: Diagnosis not present

## 2021-04-12 DIAGNOSIS — L570 Actinic keratosis: Secondary | ICD-10-CM | POA: Diagnosis not present

## 2021-04-18 DIAGNOSIS — H401121 Primary open-angle glaucoma, left eye, mild stage: Secondary | ICD-10-CM | POA: Diagnosis not present

## 2021-04-18 DIAGNOSIS — H401113 Primary open-angle glaucoma, right eye, severe stage: Secondary | ICD-10-CM | POA: Diagnosis not present

## 2021-05-30 ENCOUNTER — Encounter: Payer: Self-pay | Admitting: Physician Assistant

## 2021-05-30 ENCOUNTER — Ambulatory Visit: Payer: Medicare PPO | Admitting: Physician Assistant

## 2021-05-30 VITALS — BP 138/90 | HR 73 | Ht 69.5 in | Wt 156.8 lb

## 2021-05-30 DIAGNOSIS — Z86010 Personal history of colonic polyps: Secondary | ICD-10-CM | POA: Diagnosis not present

## 2021-05-30 DIAGNOSIS — Z800 Family history of malignant neoplasm of digestive organs: Secondary | ICD-10-CM | POA: Diagnosis not present

## 2021-05-30 MED ORDER — PLENVU 140 G PO SOLR: 1.0000 | Freq: Once | ORAL | 0 refills | Status: AC

## 2021-05-30 NOTE — Progress Notes (Signed)
I agree with the above note, plan 

## 2021-05-30 NOTE — Progress Notes (Signed)
Chief Complaint: Discuss colonoscopy  HPI:    Mr. Roy Moody is a 80 year old male with a past medical history as listed below including skin cancer, known to Dr. Ardis Hughs, who was referred to me by Shon Baton, MD for discussion of a colonoscopy.    07/15/2018 colonoscopy for surveillance given history of colonic polyps on last colonoscopy in 2014 with 2 subcentimeter polyps, tubular adenoma and sessile serrated adenoma, also family history of colon cancer in his father who died in his 7s.  Finding of 8 to-6 mm polyps in the sigmoid, descending, transverse, ascending and at the apex to seal orifice removed.  Pathology showed tubular adenomas.  At that time recommended repeat in 3 years.    Today, the patient presents to clinic and tells me that he is a fairly healthy elderly gentleman, tells me that he has some problems with his eyes and was having some headaches, but really has no chronic health problems.  He would like to have his colonoscopy.    Denies fever, chills, weight loss, blood in his stool, abdominal pain, nausea, vomiting, heartburn or reflux.     Past Medical History:  Diagnosis Date   Allergy    contact allergens   Arthritis    Cancer ([place])    skin cancer   Cataract    removed    Glaucoma    History of migraine headaches    Hypertension     Past Surgical History:  Procedure Laterality Date   COLONOSCOPY     PANCREAS SURGERY  2008   POLYPECTOMY     skin cancer removal     several   UPPER GASTROINTESTINAL ENDOSCOPY     VEIN LIGATION Right 2000   leg    Current Outpatient Medications  Medication Sig Dispense Refill   bimatoprost (LUMIGAN) 0.03 % ophthalmic solution 1 drop at bedtime.     HYDROcodone-acetaminophen (NORCO) 7.5-325 MG per tablet Take 1 tablet by mouth every 6 (six) hours as needed for pain.     labetalol (NORMODYNE) 300 MG tablet Take 300 mg by mouth 2 (two) times daily.     Netarsudil Dimesylate (RHOPRESSA) 0.02 % SOLN Apply to eye.      SUMAtriptan (IMITREX) 20 MG/ACT nasal spray Place 1 spray into the nose every 2 (two) hours as needed for migraine. May repeat in 2 hours if headache persists or recurs.     timolol (BETIMOL) 0.25 % ophthalmic solution 1-2 drops 2 (two) times daily.     No current facility-administered medications for this visit.    Allergies as of 05/30/2021   (No Known Allergies)    Family History  Problem Relation Age of Onset   Colon cancer Father 83   Colon polyps Neg Hx    Esophageal cancer Neg Hx    Rectal cancer Neg Hx    Stomach cancer Neg Hx     Social History   Socioeconomic History   Marital status: Married    Spouse name: Not on file   Number of children: Not on file   Years of education: Not on file   Highest education level: Not on file  Occupational History   Not on file  Tobacco Use   Smoking status: Never   Smokeless tobacco: Never  Vaping Use   Vaping Use: Never used  Substance and Sexual Activity   Alcohol use: Yes    Alcohol/week: 7.0 standard drinks    Types: 7 Glasses of wine per week   Drug use: No  Sexual activity: Not on file  Other Topics Concern   Not on file  Social History Narrative   Not on file   Social Determinants of Health   Financial Resource Strain: Not on file  Food Insecurity: Not on file  Transportation Needs: Not on file  Physical Activity: Not on file  Stress: Not on file  Social Connections: Not on file  Intimate Partner Violence: Not on file    Review of Systems:    Constitutional: No weight loss, fever or chills Cardiovascular: No chest pain  Respiratory: No SOB  Gastrointestinal: See HPI and otherwise negative   Physical Exam:  Vital signs: BP 138/90   Pulse 73   Ht 5' 9.5" (1.765 m)   Wt 156 lb 12.8 oz (71.1 kg)   BMI 22.82 kg/m   Constitutional:   Pleasant elderly Caucasian male appears to be in NAD, Well developed, Well nourished, alert and cooperative Respiratory: Respirations even and unlabored. Lungs clear to  auscultation bilaterally.   No wheezes, crackles, or rhonchi.  Cardiovascular: Normal S1, S2. No MRG. Regular rate and rhythm. No peripheral edema, cyanosis or pallor.  Gastrointestinal:  Soft, nondistended, nontender. No rebound or guarding. Normal bowel sounds. No appreciable masses or hepatomegaly. Rectal:  Not performed.  Psychiatric:  Demonstrates good judgement and reason without abnormal affect or behaviors.  We do not have recent labs.  Assessment: 1.  History of adenomatous polyps: Last colonoscopy 3 years ago with recommendations to repeat in 3 years 2.  Family history of colon cancer: In his father diagnosed in his 50s  Plan: 1.  Scheduled patient for surveillance colonoscopy in the [place] with Dr. Ardis Hughs.  Did provide the patient with a detailed list of risks for the procedure and he agrees to proceed. Patient is appropriate for endoscopic procedure(s) in the ambulatory ([place]) setting.  2.  Patient follow in clinic per recommendations from Dr. Ardis Hughs after time of procedure.   Ellouise Newer, PA-C [facility] 05/30/2021, 10:23 AM  Cc: Shon Baton, MD

## 2021-05-30 NOTE — Patient Instructions (Signed)
You have been scheduled for a colonoscopy. Please follow written instructions given to you at your visit today.  Please pick up your prep supplies at the pharmacy within the next 1-3 days. If you use inhalers (even only as needed), please bring them with you on the day of your procedure.  If you are age 79 or older, your body mass index should be between 23-30. Your Body mass index is 22.82 kg/m. If this is out of the aforementioned range listed, please consider follow up with your Primary Care Provider.  If you are age 39 or younger, your body mass index should be between 19-25. Your Body mass index is 22.82 kg/m. If this is out of the aformentioned range listed, please consider follow up with your Primary Care Provider.   __________________________________________________________  [facility] providers would like to encourage you to use [facility] to communicate with providers for non-urgent requests or questions.  Due to long hold times on the telephone, sending your provider a message by [facility] may be a faster and more efficient way to get a response.  Please allow 48 business hours for a response.  Please remember that this is for non-urgent requests.

## 2021-06-30 ENCOUNTER — Ambulatory Visit (AMBULATORY_SURGERY_CENTER): Payer: Medicare PPO | Admitting: Gastroenterology

## 2021-06-30 ENCOUNTER — Encounter: Payer: Self-pay | Admitting: Gastroenterology

## 2021-06-30 VITALS — BP 120/87 | HR 59 | Temp 95.5°F | Resp 12 | Ht 69.0 in | Wt 156.0 lb

## 2021-06-30 DIAGNOSIS — Z86010 Personal history of colonic polyps: Secondary | ICD-10-CM | POA: Diagnosis not present

## 2021-06-30 DIAGNOSIS — I10 Essential (primary) hypertension: Secondary | ICD-10-CM | POA: Diagnosis not present

## 2021-06-30 DIAGNOSIS — D121 Benign neoplasm of appendix: Secondary | ICD-10-CM | POA: Diagnosis not present

## 2021-06-30 DIAGNOSIS — D123 Benign neoplasm of transverse colon: Secondary | ICD-10-CM

## 2021-06-30 DIAGNOSIS — Z800 Family history of malignant neoplasm of digestive organs: Secondary | ICD-10-CM | POA: Diagnosis not present

## 2021-06-30 MED ORDER — SODIUM CHLORIDE 0.9 % IV SOLN: 500.0000 mL | Freq: Once | INTRAVENOUS | Status: DC

## 2021-06-30 NOTE — Progress Notes (Signed)
Called to room to assist during endoscopic procedure.  Patient ID and intended procedure confirmed with present staff. Received instructions for my participation in the procedure from the performing physician.  

## 2021-06-30 NOTE — Op Note (Signed)
[place] Patient Name: Roy Moody Procedure Date: 06/30/2021 9:21 AM MRN: [id number] Endoscopist: Milus Banister , MD Age: 80 Referring MD:  Date of Birth: 18-Sep-1940 Gender: Male Account #: [id number] Procedure:                Colonoscopy Indications:              High risk colon cancer surveillance: Personal                            history of colonic polyps: Colonoscopy 2014 two                            subCM adenomas. Colonsocoly 2019 8 subCM adenomas Medicines:                Monitored Anesthesia Care Procedure:                Pre-Anesthesia Assessment:                           - Prior to the procedure, a History and Physical                            was performed, and patient medications and                            allergies were reviewed. The patient's tolerance of                            previous anesthesia was also reviewed. The risks                            and benefits of the procedure and the sedation                            options and risks were discussed with the patient.                            All questions were answered, and informed consent                            was obtained. Prior Anticoagulants: The patient has                            taken no previous anticoagulant or antiplatelet                            agents. ASA Grade Assessment: II - A patient with                            mild systemic disease. After reviewing the risks                            and benefits, the patient was deemed in  satisfactory condition to undergo the procedure.                           After obtaining informed consent, the colonoscope                            was passed under direct vision. Throughout the                            procedure, the patient's blood pressure, pulse, and                            oxygen saturations were monitored continuously. The                            Olympus  CF-HQ190L (Serial# [id number]) Colonoscope was                            introduced through the anus and advanced to the the                            cecum, identified by appendiceal orifice and                            ileocecal valve. The colonoscopy was performed                            without difficulty. The patient tolerated the                            procedure well. The quality of the bowel                            preparation was good. The ileocecal valve,                            appendiceal orifice, and rectum were photographed. Scope In: 9:33:36 AM Scope Out: 9:45:14 AM Scope Withdrawal Time: 0 hours 9 minutes 15 seconds  Total Procedure Duration: 0 hours 11 minutes 38 seconds  Findings:                 Four sessile polyps were found in the transverse                            colon and appendiceal orifice. The polyps were 3 to                            5 mm in size. These polyps were removed with a cold                            snare. Resection and retrieval were complete.                           Multiple small and large-mouthed diverticula were  found in the left colon.                           External and internal hemorrhoids were found. The                            hemorrhoids were small.                           The exam was otherwise without abnormality on                            direct and retroflexion views. Complications:            No immediate complications. Estimated blood loss:                            None. Estimated Blood Loss:     Estimated blood loss: none. Impression:               - Four 3 to 5 mm polyps in the transverse colon and                            at the appendiceal orifice, removed with a cold                            snare. Resected and retrieved.                           - Diverticulosis in the left colon.                           - External and internal hemorrhoids.                            - The examination was otherwise normal on direct                            and retroflexion views. Recommendation:           - Patient has a contact number available for                            emergencies. The signs and symptoms of potential                            delayed complications were discussed with the                            patient. Return to normal activities tomorrow.                            Written discharge instructions were provided to the                            patient.                           -  Resume previous diet.                           - Continue present medications.                           - Await pathology results. Milus Banister, MD 06/30/2021 9:52:35 AM This report has been signed electronically.

## 2021-06-30 NOTE — Progress Notes (Signed)
When Salome Arnt, RN was removing IV from right arm, she saw that the tourniquet was still on right upper arm.  No redness or swelling noted.  This will be recorded in [facility]. maw

## 2021-06-30 NOTE — Patient Instructions (Signed)
Handouts were given to your care partner on polyps, diverticulosis, and hemorrhoids.  You may resume your current medications today. Await biopsy results.  May take 1-3 weeks to receive pathology results. Please call if any questions or concerns.     YOU HAD AN ENDOSCOPIC PROCEDURE TODAY AT [facility]:   Refer to the procedure report that was given to you for any specific questions about what was found during the examination.  If the procedure report does not answer your questions, please call your gastroenterologist to clarify.  If you requested that your care partner not be given the details of your procedure findings, then the procedure report has been included in a sealed envelope for you to review at your convenience later.  YOU SHOULD EXPECT: Some feelings of bloating in the abdomen. Passage of more gas than usual.  Walking can help get rid of the air that was put into your GI tract during the procedure and reduce the bloating. If you had a lower endoscopy (such as a colonoscopy or flexible sigmoidoscopy) you may notice spotting of blood in your stool or on the toilet paper. If you underwent a bowel prep for your procedure, you may not have a normal bowel movement for a few days.  Please Note:  You might notice some irritation and congestion in your nose or some drainage.  This is from the oxygen used during your procedure.  There is no need for concern and it should clear up in a day or so.  SYMPTOMS TO REPORT IMMEDIATELY:  Following lower endoscopy (colonoscopy or flexible sigmoidoscopy):  Excessive amounts of blood in the stool  Significant tenderness or worsening of abdominal pains  Swelling of the abdomen that is new, acute  Fever of 100F or higher  For urgent or emergent issues, a gastroenterologist can be reached at any hour by calling [phone]. Do not use MyChart messaging for urgent concerns.    DIET:  We do recommend a small meal at first, but then  you may proceed to your regular diet.  Drink plenty of fluids but you should avoid alcoholic beverages for 24 hours.  ACTIVITY:  You should plan to take it easy for the rest of today and you should NOT DRIVE or use heavy machinery until tomorrow (because of the sedation medicines used during the test).    FOLLOW UP: Our staff will call the number listed on your records 48-72 hours following your procedure to check on you and address any questions or concerns that you may have regarding the information given to you following your procedure. If we do not reach you, we will leave a message.  We will attempt to reach you two times.  During this call, we will ask if you have developed any symptoms of COVID 19. If you develop any symptoms (ie: fever, flu-like symptoms, shortness of breath, cough etc.) before then, please call [phone].  If you test positive for Covid 19 in the 2 weeks post procedure, please call and report this information to [place].    If any biopsies were taken you will be contacted by phone or by letter within the next 1-3 weeks.  Please call us at [phone] if you have not heard about the biopsies in 3 weeks.    SIGNATURES/CONFIDENTIALITY: You and/or your care partner have signed paperwork which will be entered into your electronic medical record.  These signatures attest to the fact that that the information above on your After Visit  Summary has been reviewed and is understood.  Full responsibility of the confidentiality of this discharge information lies with you and/or your care-partner.  

## 2021-06-30 NOTE — Progress Notes (Signed)
Pt's states no medical or surgical changes since previsit or office visit.  Vitals DT 

## 2021-06-30 NOTE — Progress Notes (Signed)
  The recent H&P (dated 05/30/2021) was reviewed, the patient was examined and there is no change in the patients condition since that H&P was completed.   Roy Moody  06/30/2021, 9:26 AM

## 2021-06-30 NOTE — Progress Notes (Signed)
Report to PACU, RN, vss, BBS= Clear.  

## 2021-07-04 ENCOUNTER — Telehealth: Payer: Self-pay

## 2021-07-04 NOTE — Telephone Encounter (Signed)
  Follow up Call-  Call back number 06/30/2021  Post procedure Call Back phone  # [phone]  Permission to leave phone message Yes  Some recent data might be hidden     Patient questions:  Do you have a fever, pain , or abdominal swelling? No. Pain Score  0 *  Have you tolerated food without any problems? Yes.    Have you been able to return to your normal activities? Yes.    Do you have any questions about your discharge instructions: Diet   No. Medications  No. Follow up visit  No.  Do you have questions or concerns about your Care? No.  Actions: * If pain score is 4 or above: No action needed, pain <4.   Have you developed a fever since your procedure? no  2.   Have you had an respiratory symptoms (SOB or cough) since your procedure? no  3.   Have you tested positive for COVID 19 since your procedure no  4.   Have you had any family members/close contacts diagnosed with the COVID 19 since your procedure?  no   If yes to any of these questions please route to Joylene John, RN and Joella Prince, RN

## 2021-07-06 ENCOUNTER — Encounter: Payer: Self-pay | Admitting: Gastroenterology

## 2021-07-28 DIAGNOSIS — I10 Essential (primary) hypertension: Secondary | ICD-10-CM | POA: Diagnosis not present

## 2021-07-28 DIAGNOSIS — I8390 Asymptomatic varicose veins of unspecified lower extremity: Secondary | ICD-10-CM | POA: Diagnosis not present

## 2021-07-28 DIAGNOSIS — H3530 Unspecified macular degeneration: Secondary | ICD-10-CM | POA: Diagnosis not present

## 2021-07-28 DIAGNOSIS — G43909 Migraine, unspecified, not intractable, without status migrainosus: Secondary | ICD-10-CM | POA: Diagnosis not present

## 2021-07-28 DIAGNOSIS — N401 Enlarged prostate with lower urinary tract symptoms: Secondary | ICD-10-CM | POA: Diagnosis not present

## 2021-07-28 DIAGNOSIS — C4491 Basal cell carcinoma of skin, unspecified: Secondary | ICD-10-CM | POA: Diagnosis not present

## 2021-07-28 DIAGNOSIS — R739 Hyperglycemia, unspecified: Secondary | ICD-10-CM | POA: Diagnosis not present

## 2021-07-28 DIAGNOSIS — M1990 Unspecified osteoarthritis, unspecified site: Secondary | ICD-10-CM | POA: Diagnosis not present

## 2021-07-28 DIAGNOSIS — K869 Disease of pancreas, unspecified: Secondary | ICD-10-CM | POA: Diagnosis not present

## 2021-10-11 DIAGNOSIS — H5021 Vertical strabismus, right eye: Secondary | ICD-10-CM | POA: Diagnosis not present

## 2021-10-11 DIAGNOSIS — H2512 Age-related nuclear cataract, left eye: Secondary | ICD-10-CM | POA: Diagnosis not present

## 2021-10-11 DIAGNOSIS — Z961 Presence of intraocular lens: Secondary | ICD-10-CM | POA: Diagnosis not present

## 2021-10-11 DIAGNOSIS — Z9889 Other specified postprocedural states: Secondary | ICD-10-CM | POA: Diagnosis not present

## 2021-10-11 DIAGNOSIS — H0102A Squamous blepharitis right eye, upper and lower eyelids: Secondary | ICD-10-CM | POA: Diagnosis not present

## 2021-10-11 DIAGNOSIS — H401113 Primary open-angle glaucoma, right eye, severe stage: Secondary | ICD-10-CM | POA: Diagnosis not present

## 2021-10-11 DIAGNOSIS — H0102B Squamous blepharitis left eye, upper and lower eyelids: Secondary | ICD-10-CM | POA: Diagnosis not present

## 2021-10-11 DIAGNOSIS — H401121 Primary open-angle glaucoma, left eye, mild stage: Secondary | ICD-10-CM | POA: Diagnosis not present

## 2021-10-17 DIAGNOSIS — Z85828 Personal history of other malignant neoplasm of skin: Secondary | ICD-10-CM | POA: Diagnosis not present

## 2021-10-17 DIAGNOSIS — C44622 Squamous cell carcinoma of skin of right upper limb, including shoulder: Secondary | ICD-10-CM | POA: Diagnosis not present

## 2021-10-17 DIAGNOSIS — L570 Actinic keratosis: Secondary | ICD-10-CM | POA: Diagnosis not present

## 2021-10-17 DIAGNOSIS — L821 Other seborrheic keratosis: Secondary | ICD-10-CM | POA: Diagnosis not present

## 2021-10-17 DIAGNOSIS — D1801 Hemangioma of skin and subcutaneous tissue: Secondary | ICD-10-CM | POA: Diagnosis not present

## 2021-10-17 DIAGNOSIS — D485 Neoplasm of uncertain behavior of skin: Secondary | ICD-10-CM | POA: Diagnosis not present

## 2021-10-27 ENCOUNTER — Other Ambulatory Visit: Payer: Self-pay

## 2021-10-27 DIAGNOSIS — M79609 Pain in unspecified limb: Secondary | ICD-10-CM | POA: Insufficient documentation

## 2021-10-27 DIAGNOSIS — I8390 Asymptomatic varicose veins of unspecified lower extremity: Secondary | ICD-10-CM

## 2021-10-27 DIAGNOSIS — N3281 Overactive bladder: Secondary | ICD-10-CM | POA: Insufficient documentation

## 2021-11-13 NOTE — Progress Notes (Signed)
[facility] [place] ? ?ASSESSMENT / PLAN: ?Roy Moody is a 81 y.o. male with chronic venous insufficiency of bilateral lower extremity causing varicosities (C2 disease).  ?Venous duplex is significant for superficial and deep venous reflux. ?He is thankfully minimally symptomatic, and does not desire any kind of intervention for this. ?Recommend compression and elevation for symptomatic relief. ?Follow up with me as needed. ? ?CHIEF COMPLAINT: Pain and swelling left knee when hiking in [place] ? ?HISTORY OF PRESENT ILLNESS: ?Roy Moody is a 81 y.o. male referred to clinic for evaluation of pain and swelling in the left knee.  This occurred while on a 2-week hiking trip through [place] with his son.  He has a long history of varicosities in bilateral lower extremities.  He is an avid hiker and walker.  He is very fit and spry.  He looks much younger than his 89 years.  He reports he was resting after a day of hiking and noticed swelling and pain around a varicosity in his left knee.  This did not limit him from finishing his trip, which he did without further difficulty.  He presents to clinic to discuss this finding.  The pain and swelling has resolved significantly.  His varicosities are not very symptomatic to him. ? ?Past Medical History:  ?Diagnosis Date  ? Allergy   ? contact allergens  ? Arthritis   ? Cancer [facility])   ? skin cancer  ? Cataract   ? removed   ? Glaucoma   ? History of migraine headaches   ? Hypertension   ? ? ?Past Surgical History:  ?Procedure Laterality Date  ? COLONOSCOPY    ? PANCREAS SURGERY  2008  ? POLYPECTOMY    ? skin cancer removal    ? several  ? UPPER GASTROINTESTINAL ENDOSCOPY    ? VEIN LIGATION Right 2000  ? leg  ? ? ?Family History  ?Problem Relation Age of Onset  ? Colon cancer Father 12  ? Colon polyps Neg Hx   ? Esophageal cancer Neg Hx   ? Rectal cancer Neg Hx   ? Stomach cancer Neg Hx   ? ? ?Social History  ? ?Socioeconomic History  ?  Marital status: Married  ?  Spouse name: Not on file  ? Number of children: Not on file  ? Years of education: Not on file  ? Highest education level: Not on file  ?Occupational History  ? Not on file  ?Tobacco Use  ? Smoking status: Never  ? Smokeless tobacco: Never  ?Vaping Use  ? Vaping Use: Never used  ?Substance and Sexual Activity  ? Alcohol use: Yes  ?  Alcohol/week: 7.0 standard drinks  ?  Types: 7 Glasses of wine per week  ? Drug use: No  ? Sexual activity: Not Currently  ?Other Topics Concern  ? Not on file  ?Social History Narrative  ? Not on file  ? ?Social Determinants of Health  ? ?Financial Resource Strain: Not on file  ?Food Insecurity: Not on file  ?Transportation Needs: Not on file  ?Physical Activity: Not on file  ?Stress: Not on file  ?Social Connections: Not on file  ?Intimate Partner Violence: Not on file  ? ? ?Allergies  ?Allergen Reactions  ? Brimonidine   ? Brimonidine Tartrate   ? Dorzolamide   ? Latex   ? Tape   ? ? ?Current Outpatient Medications  ?Medication Sig Dispense Refill  ? bimatoprost (LUMIGAN) 0.03 % ophthalmic solution  1 drop at bedtime.    ? HYDROcodone-acetaminophen (NORCO) 7.5-325 MG per tablet Take 1 tablet by mouth every 6 (six) hours as needed for pain. (Patient not taking: Reported on 06/30/2021)    ? labetalol (NORMODYNE) 300 MG tablet Take 300 mg by mouth 2 (two) times daily.    ? loratadine (CLARITIN) 10 MG tablet Take 1 tablet po qd PRN allergies    ? Netarsudil Dimesylate 0.02 % SOLN Apply to eye.    ? SUMAtriptan (IMITREX) 20 MG/ACT nasal spray USE 1 SPRAY AS DIRECTED AT ONSET OF MIGRAINE HEADACHE.    ? timolol (BETIMOL) 0.25 % ophthalmic solution 1-2 drops 2 (two) times daily.    ? triamcinolone cream (KENALOG) 0.1 %     ? ?No current facility-administered medications for this visit.  ? ? ?PHYSICAL EXAM ?Vitals:  ? 11/14/21 1007  ?BP: (!) 145/90  ?Pulse: 64  ?Resp: 20  ?Temp: 98.3 ?F (36.8 ?C)  ?SpO2: 97%  ?Weight: 160 lb (72.6 kg)  ?Height: '5\' 9"'$  (1.753 m)   ? ? ?Constitutional: Well-appearing elderly man in no acute distress.  Thin and fit. ?Cardiac: Regular rate and rhythm.  ?Respiratory:  unlabored. ?Abdominal: non-distended.  ?Peripheral vascular: Large, ropey varicosities in the greater saphenous vein distribution of bilateral lower extremities.  About the knee a varicosity has a cordlike feeling consistent with prior thrombophlebitis.  Palpable dorsalis pedis pulses bilaterally. ?Extremity: no edema. no cyanosis. no pallor.  ?Skin: no gangrene. no ulceration.  ?Lymphatic: no Stemmer's sign.  ? ?PERTINENT LABORATORY AND RADIOLOGIC DATA ? ?Venous reflux study ?Left:  ?- No evidence of deep vein thrombosis seen in the left lower extremity,  ?from the common femoral through the popliteal veins.  ?- Focal thrombus in a varicose vein with a branch to the GSV at the knee.  ?There is no thrombus in the connecting vein.  ?- Deep vein reflux in the CFV and FV.  ?- Superficial vein reflux in the SFJ and GSV.  ? ?Yevonne Aline. Stanford Breed, MD ?[facility] [place] ?Office Phone Number: [phone] ?11/13/2021 7:28 PM ? ?Total time spent on preparing this encounter including chart review, data review, collecting history, examining the patient, coordinating care for this new patient, 45 minutes. ? ?Portions of this report may have been transcribed using voice recognition software.  Every effort has been made to ensure accuracy; however, inadvertent computerized transcription errors may still be present. ? ? ? ?

## 2021-11-14 ENCOUNTER — Ambulatory Visit: Payer: Medicare PPO | Admitting: Vascular Surgery

## 2021-11-14 ENCOUNTER — Other Ambulatory Visit: Payer: Self-pay

## 2021-11-14 ENCOUNTER — Encounter: Payer: Self-pay | Admitting: Vascular Surgery

## 2021-11-14 ENCOUNTER — Ambulatory Visit (HOSPITAL_COMMUNITY)
Admission: RE | Admit: 2021-11-14 | Discharge: 2021-11-14 | Disposition: A | Discharge status: Home / Self Care | Payer: Medicare PPO | Source: Ambulatory Visit | Attending: Vascular Surgery | Admitting: Vascular Surgery

## 2021-11-14 ENCOUNTER — Encounter (HOSPITAL_COMMUNITY): Payer: Medicare PPO

## 2021-11-14 VITALS — BP 145/90 | HR 64 | Temp 98.3°F | Resp 20 | Ht 69.0 in | Wt 160.0 lb

## 2021-11-14 DIAGNOSIS — I8390 Asymptomatic varicose veins of unspecified lower extremity: Secondary | ICD-10-CM | POA: Insufficient documentation

## 2021-11-14 DIAGNOSIS — I8392 Asymptomatic varicose veins of left lower extremity: Secondary | ICD-10-CM | POA: Diagnosis not present

## 2021-11-14 DIAGNOSIS — I872 Venous insufficiency (chronic) (peripheral): Secondary | ICD-10-CM | POA: Diagnosis not present

## 2021-12-03 ENCOUNTER — Other Ambulatory Visit: Payer: Self-pay

## 2021-12-03 ENCOUNTER — Inpatient Hospital Stay (HOSPITAL_BASED_OUTPATIENT_CLINIC_OR_DEPARTMENT_OTHER)
Admission: EM | Admit: 2021-12-03 | Discharge: 2021-12-05 | DRG: 149 | Disposition: A | Discharge status: Home / Self Care | Payer: Medicare PPO | Attending: Family Medicine | Admitting: Family Medicine

## 2021-12-03 ENCOUNTER — Emergency Department (HOSPITAL_BASED_OUTPATIENT_CLINIC_OR_DEPARTMENT_OTHER): Payer: Medicare PPO

## 2021-12-03 ENCOUNTER — Observation Stay (HOSPITAL_COMMUNITY): Payer: Medicare PPO

## 2021-12-03 ENCOUNTER — Encounter (HOSPITAL_COMMUNITY): Payer: Self-pay | Admitting: Family Medicine

## 2021-12-03 DIAGNOSIS — H02401 Unspecified ptosis of right eyelid: Secondary | ICD-10-CM | POA: Diagnosis present

## 2021-12-03 DIAGNOSIS — I160 Hypertensive urgency: Secondary | ICD-10-CM | POA: Diagnosis present

## 2021-12-03 DIAGNOSIS — R001 Bradycardia, unspecified: Secondary | ICD-10-CM | POA: Diagnosis present

## 2021-12-03 DIAGNOSIS — Z91048 Other nonmedicinal substance allergy status: Secondary | ICD-10-CM

## 2021-12-03 DIAGNOSIS — Z888 Allergy status to other drugs, medicaments and biological substances status: Secondary | ICD-10-CM

## 2021-12-03 DIAGNOSIS — H409 Unspecified glaucoma: Secondary | ICD-10-CM | POA: Diagnosis present

## 2021-12-03 DIAGNOSIS — G43909 Migraine, unspecified, not intractable, without status migrainosus: Secondary | ICD-10-CM | POA: Diagnosis present

## 2021-12-03 DIAGNOSIS — H8110 Benign paroxysmal vertigo, unspecified ear: Secondary | ICD-10-CM | POA: Diagnosis present

## 2021-12-03 DIAGNOSIS — Z79899 Other long term (current) drug therapy: Secondary | ICD-10-CM | POA: Diagnosis not present

## 2021-12-03 DIAGNOSIS — Z91040 Latex allergy status: Secondary | ICD-10-CM

## 2021-12-03 DIAGNOSIS — Z20822 Contact with and (suspected) exposure to covid-19: Secondary | ICD-10-CM | POA: Diagnosis present

## 2021-12-03 DIAGNOSIS — Z85828 Personal history of other malignant neoplasm of skin: Secondary | ICD-10-CM | POA: Diagnosis not present

## 2021-12-03 DIAGNOSIS — R42 Dizziness and giddiness: Secondary | ICD-10-CM | POA: Diagnosis present

## 2021-12-03 DIAGNOSIS — R519 Headache, unspecified: Secondary | ICD-10-CM

## 2021-12-03 DIAGNOSIS — I10 Essential (primary) hypertension: Secondary | ICD-10-CM | POA: Diagnosis present

## 2021-12-03 DIAGNOSIS — I951 Orthostatic hypotension: Secondary | ICD-10-CM | POA: Diagnosis present

## 2021-12-03 LAB — COMPREHENSIVE METABOLIC PANEL
ALT: 15 U/L (ref 0–44)
AST: 19 U/L (ref 15–41)
Albumin: 4.4 g/dL (ref 3.5–5.0)
Alkaline Phosphatase: 67 U/L (ref 38–126)
Anion gap: 9 (ref 5–15)
BUN: 26 mg/dL — ABNORMAL HIGH (ref 8–23)
CO2: 28 mmol/L (ref 22–32)
Calcium: 9.5 mg/dL (ref 8.9–10.3)
Chloride: 103 mmol/L (ref 98–111)
Creatinine, Ser: 0.77 mg/dL (ref 0.61–1.24)
GFR, Estimated: >60 mL/min (ref >60)
Glucose, Bld: 133 mg/dL — ABNORMAL HIGH (ref 70–99)
Potassium: 4.4 mmol/L (ref 3.5–5.1)
Sodium: 140 mmol/L (ref 135–145)
Total Bilirubin: 0.7 mg/dL (ref 0.3–1.2)
Total Protein: 6.9 g/dL (ref 6.5–8.1)

## 2021-12-03 LAB — CBC
HCT: 44.8 % (ref 39.0–52.0)
Hemoglobin: 15 g/dL (ref 13.0–17.0)
MCH: 29.4 pg (ref 26.0–34.0)
MCHC: 33.5 g/dL (ref 30.0–36.0)
MCV: 87.7 fL (ref 80.0–100.0)
Platelets: 213 10*3/uL (ref 150–400)
RBC: 5.11 MIL/uL (ref 4.22–5.81)
RDW: 12.9 % (ref 11.5–15.5)
WBC: 8.8 10*3/uL (ref 4.0–10.5)
nRBC: 0 % (ref 0.0–0.2)

## 2021-12-03 LAB — PROTIME-INR
INR: 1 (ref 0.8–1.2)
Prothrombin Time: 13 seconds (ref 11.4–15.2)

## 2021-12-03 LAB — DIFFERENTIAL
Abs Immature Granulocytes: 0.02 10*3/uL (ref 0.00–0.07)
Basophils Absolute: 0 10*3/uL (ref 0.0–0.1)
Basophils Relative: 1 %
Eosinophils Absolute: 0.1 10*3/uL (ref 0.0–0.5)
Eosinophils Relative: 1 %
Immature Granulocytes: 0 %
Lymphocytes Relative: 23 %
Lymphs Abs: 2 10*3/uL (ref 0.7–4.0)
Monocytes Absolute: 0.4 10*3/uL (ref 0.1–1.0)
Monocytes Relative: 5 %
Neutro Abs: 6.2 10*3/uL (ref 1.7–7.7)
Neutrophils Relative %: 70 %

## 2021-12-03 LAB — URINALYSIS, ROUTINE W REFLEX MICROSCOPIC
Bilirubin Urine: NEGATIVE
Glucose, UA: NEGATIVE mg/dL
Hgb urine dipstick: NEGATIVE
Ketones, ur: NEGATIVE mg/dL
Leukocytes,Ua: NEGATIVE
Nitrite: NEGATIVE
Protein, ur: NEGATIVE mg/dL
Specific Gravity, Urine: 1.015 (ref 1.005–1.030)
pH: 7.5 (ref 5.0–8.0)

## 2021-12-03 LAB — RESP PANEL BY RT-PCR (FLU A&B, COVID) ARPGX2
Influenza A by PCR: NEGATIVE
Influenza B by PCR: NEGATIVE
SARS Coronavirus 2 by RT PCR: NEGATIVE

## 2021-12-03 LAB — ETHANOL: Alcohol, Ethyl (B): <10 mg/dL (ref <10)

## 2021-12-03 LAB — RAPID URINE DRUG SCREEN, HOSP PERFORMED
Amphetamines: NOT DETECTED
Barbiturates: NOT DETECTED
Benzodiazepines: NOT DETECTED
Cocaine: NOT DETECTED
Opiates: NOT DETECTED
Tetrahydrocannabinol: NOT DETECTED

## 2021-12-03 LAB — APTT: aPTT: 28 seconds (ref 24–36)

## 2021-12-03 MED ORDER — TIMOLOL HEMIHYDRATE 0.25 % OP SOLN: 1.0000 [drp] | Freq: Two times a day (BID) | OPHTHALMIC | Status: DC

## 2021-12-03 MED ORDER — SODIUM CHLORIDE 0.9 % IV SOLN
100.0000 mL/h | INTRAVENOUS | Status: DC
  Administered 2021-12-03 – 2021-12-04 (×3): 100 mL/h via INTRAVENOUS

## 2021-12-03 MED ORDER — HYDROCHLOROTHIAZIDE 12.5 MG PO TABS
12.5000 mg | ORAL_TABLET | Freq: Every day | ORAL | Status: DC
  Administered 2021-12-03 – 2021-12-04 (×2): 12.5 mg via ORAL
  Filled 2021-12-03 (×2): qty 1

## 2021-12-03 MED ORDER — LABETALOL HCL 5 MG/ML IV SOLN
20.0000 mg | Freq: Once | INTRAVENOUS | Status: AC
Start: 2021-12-03 — End: 2021-12-03
  Administered 2021-12-03: 20 mg via INTRAVENOUS
  Filled 2021-12-03: qty 4

## 2021-12-03 MED ORDER — TIMOLOL MALEATE 0.5 % OP SOLN
1.0000 [drp] | Freq: Two times a day (BID) | OPHTHALMIC | Status: DC
  Administered 2021-12-04 – 2021-12-05 (×3): 1 [drp] via OPHTHALMIC
  Filled 2021-12-03: qty 5

## 2021-12-03 MED ORDER — HYDRALAZINE HCL 20 MG/ML IJ SOLN
5.0000 mg | INTRAMUSCULAR | Status: DC | PRN
  Administered 2021-12-03 – 2021-12-04 (×2): 5 mg via INTRAVENOUS
  Filled 2021-12-03 (×3): qty 1

## 2021-12-03 MED ORDER — MECLIZINE HCL 25 MG PO TABS
25.0000 mg | ORAL_TABLET | Freq: Three times a day (TID) | ORAL | Status: DC | PRN
  Administered 2021-12-03: 25 mg via ORAL
  Filled 2021-12-03: qty 1

## 2021-12-03 MED ORDER — LABETALOL HCL 5 MG/ML IV SOLN
10.0000 mg | Freq: Once | INTRAVENOUS | Status: AC
  Administered 2021-12-03: 10 mg via INTRAVENOUS
  Filled 2021-12-03: qty 4

## 2021-12-03 MED ORDER — AMLODIPINE BESYLATE 5 MG PO TABS
5.0000 mg | ORAL_TABLET | Freq: Every day | ORAL | Status: DC
  Administered 2021-12-03 – 2021-12-04 (×2): 5 mg via ORAL
  Filled 2021-12-03 (×2): qty 1

## 2021-12-03 MED ORDER — ENOXAPARIN SODIUM 40 MG/0.4ML IJ SOSY
40.0000 mg | PREFILLED_SYRINGE | INTRAMUSCULAR | Status: DC
  Administered 2021-12-03 – 2021-12-04 (×2): 40 mg via SUBCUTANEOUS
  Filled 2021-12-03 (×2): qty 0.4

## 2021-12-03 MED ORDER — GADOBUTROL 1 MMOL/ML IV SOLN
7.0000 mL | Freq: Once | INTRAVENOUS | Status: AC | PRN
  Administered 2021-12-03: 7 mL via INTRAVENOUS

## 2021-12-03 MED ORDER — ACETAMINOPHEN 325 MG PO TABS
650.0000 mg | ORAL_TABLET | Freq: Four times a day (QID) | ORAL | Status: DC | PRN
  Administered 2021-12-03: 650 mg via ORAL
  Filled 2021-12-03: qty 2

## 2021-12-03 MED ORDER — LATANOPROST 0.005 % OP SOLN
1.0000 [drp] | Freq: Every day | OPHTHALMIC | Status: DC
  Administered 2021-12-03 – 2021-12-04 (×2): 1 [drp] via OPHTHALMIC
  Filled 2021-12-03: qty 2.5

## 2021-12-03 MED ORDER — SODIUM CHLORIDE 0.9 % IV BOLUS
500.0000 mL | Freq: Once | INTRAVENOUS | Status: AC
  Administered 2021-12-03: 500 mL via INTRAVENOUS

## 2021-12-03 MED ORDER — NETARSUDIL DIMESYLATE 0.02 % OP SOLN
1.0000 [drp] | Freq: Every day | OPHTHALMIC | Status: DC
  Administered 2021-12-04: 1 [drp] via OPHTHALMIC

## 2021-12-03 MED ORDER — ACETAMINOPHEN 650 MG RE SUPP: 650.0000 mg | Freq: Four times a day (QID) | RECTAL | Status: DC | PRN

## 2021-12-03 NOTE — ED Notes (Signed)
[facility] at bedside Handoff report given 

## 2021-12-03 NOTE — ED Notes (Signed)
Handoff report given to Jessica RN.

## 2021-12-03 NOTE — Progress Notes (Signed)
Patient rec'd from EMS. Oriented to room. Questions answered. MD at bedside for evaluation. ?

## 2021-12-03 NOTE — Assessment & Plan Note (Addendum)
Prolonged PR, sinus ?Rate improved, tolerating labetalol ?

## 2021-12-03 NOTE — Assessment & Plan Note (Addendum)
BP's improved 4/11 AM to 140's - 150's  ?As we uptitrated oral meds aggressively, orthostatics were checked -> positive, with drop to as low as 42'P systolic ?He received HCTZ, amlodipine, and labetalol this AM.  Losartan was discontinued this AM.  Will hold further labetalol today.  Follow BP with bolus.  If his orthostatics improve, will plan for discharge this afternoon.  Otherwise, I think we should watch until 4/12.   ?Plan to discontinue HCTZ and losartan.  Tomorrow, will resume only amlodipine 5 mg and labetalol 400 mg qam, 200 mg in afternoon, and 400 mg qpm. 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

## 2021-12-03 NOTE — ED Triage Notes (Signed)
Pt arrived POV and ambulatory. Pt caox4 in no obvious distress. Pt reports multiple episodes of "vertigo" described as the "room is spinning" intermittent since approx 0445 this morning. Pt denies N/V, CP, SOB, and dizziness at present. Pt reports he took his BP at home and it has been well controlled for years however this morning his BP was in the 119'E systolic. Pt reports one episode of vertigo in his past which subsided after stopping gabapentin which was approx 1.5 years ago. No recent change in medications.  ?

## 2021-12-03 NOTE — ED Notes (Signed)
Patient transported to CT 

## 2021-12-03 NOTE — Progress Notes (Signed)
Plan of Care Note for accepted transfer ? ? ?Patient: Roy Moody MRN: [id number]   DOA: 12/03/2021 ? ?Facility requesting transfer: [facility]. ?Requesting Provider: Dorie Rank, MD. ?Reason for transfer: Vertigo and hypertensive urgency. ?Facility course:  ?81 year old male with a past medical history of migraine headaches, hypertension, glaucoma who presented to the emergency department with complaints of elevated blood pressure and birthed ago.  The patient stated that he felt a spinning sensation similar to when he had a vertigo episode about a year and a half, which resolved after he discontinued gabapentin.  His systolic blood pressure has been in the 200s.  He has been given labetalol to treated.  CT head was negative.  Transfer requested for MRI imaging and blood pressure control. ? ?Plan of care: ?The patient is accepted for admission to Progressive unit, at [facility]. ? ?Author: ?Reubin Milan, MD ?12/03/2021 ? ?Check [email or web address] for on-call coverage. ? ?Nursing staff, Please call [facility] number on Amion as soon as patient's arrival, so appropriate admitting provider can evaluate the pt. ?

## 2021-12-03 NOTE — H&P (Signed)
?History and Physical  ? ? ?Roy Moody NAT:[id number] DOB: 01/31/1941 DOA: 12/03/2021 ? ?PCP: Roy Baton, MD  ?Patient coming from: home ? ?I have personally briefly reviewed patient's old medical records in [facility] ? ?Chief Complaint: vertigo ? ?HPI: Roy Moody is Roy Moody 81 y.o. male with medical history significant of HTN, glaucoma, migraine HA who presented to [facility] with new onset vertigo. ? ?He noticed it around 430 when he got up.  He went to the bathroom, then was going to lay down for Roy Moody few more minutes and when turning to his sided developed sensation of the room spinning.  Lasted about 15 seconds.  He subsequently had 2 more episodes about Roy Moody minute and Roy Moody half each when he was up and felt unsteady during that.  He took his BP and noticed it was high so he called his doctor who advised him to go to the ED.  No fevers, chills, hearing changes, chest pain, SOB, N/V/D, numbness, tingling, weakness.  Chronic R eye ptosis noted.  No smoking, drinks Desmond Tufano glass of wine nightly.  ? ?ED Course: Labs, head CT.  Labetalol for high BP.  Admit to hospitalist. ? ?Review of Systems: As per HPI otherwise all other systems reviewed and are negative. ? ?Past Medical History:  ?Diagnosis Date  ? Allergy   ? contact allergens  ? Arthritis   ? Cancer [facility])   ? skin cancer  ? Cataract   ? removed   ? Glaucoma   ? History of migraine headaches   ? Hypertension   ? ? ?Past Surgical History:  ?Procedure Laterality Date  ? COLONOSCOPY    ? PANCREAS SURGERY  2008  ? POLYPECTOMY    ? skin cancer removal    ? several  ? UPPER GASTROINTESTINAL ENDOSCOPY    ? VEIN LIGATION Right 2000  ? leg  ? ? ?Social History ? reports that he has never smoked. He has never used smokeless tobacco. He reports current alcohol use of about 7.0 standard drinks per week. He reports that he does not use drugs. ? ?Allergies  ?Allergen Reactions  ? Brimonidine   ? Brimonidine Tartrate   ? Dorzolamide   ? Latex   ? Tape   ? ? ?Family  History  ?Problem Relation Age of Onset  ? Colon cancer Father 69  ? Colon polyps Neg Hx   ? Esophageal cancer Neg Hx   ? Rectal cancer Neg Hx   ? Stomach cancer Neg Hx   ? ? ?Prior to Admission medications   ?Medication Sig Start Date End Date Taking? Authorizing Provider  ?bimatoprost (LUMIGAN) 0.03 % ophthalmic solution 1 drop at bedtime.    [provider]  ?HYDROcodone-acetaminophen (NORCO) 7.5-325 MG per tablet Take 1 tablet by mouth every 6 (six) hours as needed for pain. ?Patient not taking: Reported on 06/30/2021    [provider]  ?labetalol (NORMODYNE) 300 MG tablet Take 300 mg by mouth 2 (two) times daily.    [provider]  ?loratadine (CLARITIN) 10 MG tablet Take 1 tablet po qd PRN allergies 01/08/19   [provider]  ?Netarsudil Dimesylate 0.02 % SOLN Apply to eye.    [provider]  ?SUMAtriptan (IMITREX) 20 MG/ACT nasal spray USE 1 SPRAY AS DIRECTED AT ONSET OF MIGRAINE HEADACHE.    [provider]  ?timolol (BETIMOL) 0.25 % ophthalmic solution 1-2 drops 2 (two) times daily.    [provider]  ?triamcinolone cream (KENALOG)  0.1 %  07/11/17   [provider]  ? ? ?Physical Exam: ?Vitals:  ? 12/03/21 1100 12/03/21 1355 12/03/21 1621 12/03/21 1816  ?BP: (!) 204/97 (!) 173/142 (!) 193/105 (!) 221/92  ?Pulse: (!) 56 63 (!) 56 (!) 59  ?Resp: (!) 39 18 16   ?Temp:  97.8 ?F (36.6 ?C) 97.7 ?F (36.5 ?C)   ?TempSrc:  Oral Oral   ?SpO2: 96% 95% 99%   ?Weight:   69.6 kg   ?Height:   '5\' 9"'$  (1.753 m)   ? ? ?Constitutional: NAD, calm, comfortable ?Vitals:  ? 12/03/21 1100 12/03/21 1355 12/03/21 1621 12/03/21 1816  ?BP: (!) 204/97 (!) 173/142 (!) 193/105 (!) 221/92  ?Pulse: (!) 56 63 (!) 56 (!) 59  ?Resp: (!) 39 18 16   ?Temp:  97.8 ?F (36.6 ?C) 97.7 ?F (36.5 ?C)   ?TempSrc:  Oral Oral   ?SpO2: 96% 95% 99%   ?Weight:   69.6 kg   ?Height:   '5\' 9"'$  (1.753 m)   ? ?Eyes: PERRL, lids and conjunctivae normal ?ENMT: Mucous membranes are moist.  Posterior pharynx clear of any exudate or lesions.Normal dentition.  ?Neck: normal, supple, no masses, no thyromegaly ?Respiratory: clear to auscultation bilaterally, no wheezing, no crackles. Normal respiratory effort. No accessory muscle use.  ?Cardiovascular: Regular rate and rhythm, no murmurs / rubs / gallops. No extremity edema. 2+ pedal pulses. No carotid bruits.  ?Abdomen: no tenderness, no masses palpated. No hepatosplenomegaly. Bowel sounds positive.  ?Musculoskeletal: no clubbing / cyanosis. No joint deformity upper and lower extremities. Good ROM, no contractures. Normal muscle tone.  ?Skin: no rashes, lesions, ulcers. No induration ?Neurologic: CN 2-12 grossly intact. Sensation intact, DTR normal. Strength 5/5 in all 4.  ?Psychiatric: Normal judgment and insight. Alert and oriented x 3. Normal mood.  ? ?Labs on Admission: I have personally reviewed following labs and imaging studies ? ?CBC: ?Recent Labs  ?Lab 12/03/21 ?0950  ?WBC 8.8  ?NEUTROABS 6.2  ?HGB 15.0  ?HCT 44.8  ?MCV 87.7  ?PLT 213  ? ? ?Basic Metabolic Panel: ?Recent Labs  ?Lab 12/03/21 ?0950  ?NA 140  ?K 4.4  ?CL 103  ?CO2 28  ?GLUCOSE 133*  ?BUN 26*  ?CREATININE 0.77  ?CALCIUM 9.5  ? ? ?GFR: ?Estimated Creatinine Clearance: 72.5 mL/min (by C-G formula based on SCr of 0.77 mg/dL). ? ?Liver Function Tests: ?Recent Labs  ?Lab 12/03/21 ?0950  ?AST 19  ?ALT 15  ?ALKPHOS 67  ?BILITOT 0.7  ?PROT 6.9  ?ALBUMIN 4.4  ? ? ?Urine analysis: ?   ?Component Value Date/Time  ? COLORURINE COLORLESS (Roy Moody) 12/03/2021 1050  ? APPEARANCEUR CLEAR 12/03/2021 1050  ? LABSPEC 1.015 12/03/2021 1050  ? PHURINE 7.5 12/03/2021 1050  ? GLUCOSEU NEGATIVE 12/03/2021 1050  ? HGBUR NEGATIVE 12/03/2021 1050  ? [place] NEGATIVE 12/03/2021 1050  ? KETONESUR NEGATIVE 12/03/2021 1050  ? PROTEINUR NEGATIVE 12/03/2021 1050  ? NITRITE NEGATIVE 12/03/2021 1050  ? LEUKOCYTESUR NEGATIVE 12/03/2021 1050  ? ? ?Radiological Exams on Admission: ?CT HEAD WO CONTRAST ? ?Result Date:  12/03/2021 ?CLINICAL DATA:  Vertigo.  Hypertension. EXAM: CT HEAD WITHOUT CONTRAST TECHNIQUE: Contiguous axial images were obtained from the base of the skull through the vertex without intravenous contrast. RADIATION DOSE REDUCTION: This exam was performed according to the departmental dose-optimization program which includes automated exposure control, adjustment of the mA and/or kV according to patient size and/or use of iterative reconstruction technique. COMPARISON:  None. FINDINGS: Brain: There is no evidence for acute  hemorrhage, hydrocephalus, mass lesion, or abnormal extra-axial fluid collection. No definite CT evidence for acute infarction. Vascular: No hyperdense vessel or unexpected calcification. Skull: No evidence for fracture. No worrisome lytic or sclerotic lesion. Sinuses/Orbits: The visualized paranasal sinuses and mastoid air cells are clear. Visualized portions of the globes and intraorbital fat are unremarkable. Other: None. IMPRESSION: No acute intracranial abnormality. Electronically Signed   By: Misty Stanley M.D.   On: 12/03/2021 10:21   ? ?EKG: Independently reviewed. Sinus brady, RBBB, prolonged PR ? ?Assessment/Plan ?Principal Problem: ?  Hypertensive urgency ?Active Problems: ?  Vertigo ?  Sinus bradycardia ?  ? ?Assessment and Plan: ?* Hypertensive urgency ?Labetalol with holding parameters given HR ?Adjust oral regimen ?Prn hydral ? ?Vertigo ?New onset, room spinning this morning ?3 occasions, lasted seconds to about Jarry Manon minute ?Unsteady when it happens, but was able to walk to car and get to bed without issue after it resolved ?No changes in hearing ?Given increased BP, will follow MRI brain  ?Therapy for vestibular rehab ? ? ? ?Sinus bradycardia ?Prolonged PR, sinus ?Holding parameters for labetalol  ? ? ?DVT prophylaxis: lovenox  ?Code Status:   full  ?Family Communication:  none  ?Disposition Plan:  ? Patient is from:  home ? Anticipated DC to:  home ? Anticipated DC date:  Hopefully  4/10 ? Anticipated DC barriers: Pending completion of vertigo w/u  ?Consults called:  none  ?Admission status:  inpatient  ? ?Severity of Illness: ?The appropriate patient status for this patient is OBSERVATION.

## 2021-12-03 NOTE — ED Provider Notes (Signed)
?[facility] ?Provider Note ? ? ?CSN: [id number] ?Arrival date & time: 12/03/21  0856 ? ?  ? ?History ? ?Chief Complaint  ?Patient presents with  ? Dizziness  ?  Hypertension  ? ? ?Roy Moody is a 81 y.o. male. ? ? ?Dizziness ? ?Patient has a history of migraines, hypertension glaucoma who presents with complaints of hypertension and dizziness.  Patient states he was feeling somewhat off balance and had sensation of vertigo with the room moving on.  He took his blood pressure and it was elevated this morning at greater than 962 systolic.  This is very unusual for him as patient has been on the same blood pressure medication for years.  His blood pressure is usually in the 130s to 140s.  He is not having any chest pain or shortness of breath.  He does not have any trouble with his speech or vision.  No focal weakness. ? ?Home Medications ?Prior to Admission medications   ?Medication Sig Start Date End Date Taking? Authorizing Provider  ?bimatoprost (LUMIGAN) 0.03 % ophthalmic solution 1 drop at bedtime.    [provider]  ?HYDROcodone-acetaminophen (NORCO) 7.5-325 MG per tablet Take 1 tablet by mouth every 6 (six) hours as needed for pain. ?Patient not taking: Reported on 06/30/2021    [provider]  ?labetalol (NORMODYNE) 300 MG tablet Take 300 mg by mouth 2 (two) times daily.    [provider]  ?loratadine (CLARITIN) 10 MG tablet Take 1 tablet po qd PRN allergies 01/08/19   [provider]  ?Netarsudil Dimesylate 0.02 % SOLN Apply to eye.    [provider]  ?SUMAtriptan (IMITREX) 20 MG/ACT nasal spray USE 1 SPRAY AS DIRECTED AT ONSET OF MIGRAINE HEADACHE.    [provider]  ?timolol (BETIMOL) 0.25 % ophthalmic solution 1-2 drops 2 (two) times daily.    [provider]  ?triamcinolone cream (KENALOG) 0.1 %  07/11/17   [provider]  ?   ? ?Allergies    ?Brimonidine, Brimonidine tartrate, Dorzolamide,  Latex, and Tape   ? ?Review of Systems   ?Review of Systems  ?Constitutional:  Negative for fever.  ?Neurological:  Positive for dizziness.  ? ?Physical Exam ?Updated Vital Signs ?BP (!) 204/97   Pulse (!) 56   Temp 97.6 ?F (36.4 ?C) (Oral)   Resp (!) 39   Ht 1.753 m ('5\' 9"'$ )   Wt 70.3 kg   SpO2 96%   BMI 22.89 kg/m?  ?Physical Exam ?Vitals and nursing note reviewed.  ?Constitutional:   ?   General: He is not in acute distress. ?   Appearance: He is well-developed.  ?HENT:  ?   Head: Normocephalic and atraumatic.  ?   Right Ear: External ear normal.  ?   Left Ear: External ear normal.  ?Eyes:  ?   General: No scleral icterus.    ?   Right eye: No discharge.     ?   Left eye: No discharge.  ?   Conjunctiva/sclera: Conjunctivae normal.  ?Neck:  ?   Trachea: No tracheal deviation.  ?Cardiovascular:  ?   Rate and Rhythm: Normal rate and regular rhythm.  ?Pulmonary:  ?   Effort: Pulmonary effort is normal. No respiratory distress.  ?   Breath sounds: Normal breath sounds. No stridor. No wheezing or rales.  ?Abdominal:  ?   General: Bowel sounds are normal. There is no distension.  ?   Palpations: Abdomen is soft.  ?  Tenderness: There is no abdominal tenderness. There is no guarding or rebound.  ?Musculoskeletal:     ?   General: No tenderness.  ?   Cervical back: Neck supple.  ?Skin: ?   General: Skin is warm and dry.  ?   Findings: No rash.  ?Neurological:  ?   Mental Status: He is alert and oriented to person, place, and time.  ?   Cranial Nerves: No cranial nerve deficit.  ?   Sensory: No sensory deficit.  ?   Motor: No abnormal muscle tone or seizure activity.  ?   Coordination: Coordination normal.  ?   Comments: No pronator drift bilateral upper extrem, able to hold both legs off bed for 5 seconds, sensation intact in all extremities, no visual field cuts, no left or right sided neglect, normal finger-nose exam bilaterally, no nystagmus noted ? ?No facial droop, extraocular movements intact, tongue midline   ? ? ?ED Results / Procedures / Treatments   ?Labs ?(all labs ordered are listed, but only abnormal results are displayed) ?Labs Reviewed  ?COMPREHENSIVE METABOLIC PANEL - Abnormal; Notable for the following components:  ?    Result Value  ? Glucose, Bld 133 (*)   ? BUN 26 (*)   ? All other components within normal limits  ?URINALYSIS, ROUTINE W REFLEX MICROSCOPIC - Abnormal; Notable for the following components:  ? Color, Urine COLORLESS (*)   ? All other components within normal limits  ?RESP PANEL BY RT-PCR (FLU A&B, COVID) [id number]  ?ETHANOL  ?PROTIME-INR  ?APTT  ?CBC  ?DIFFERENTIAL  ?RAPID URINE DRUG SCREEN, HOSP PERFORMED  ? ? ?EKG ?EKG Interpretation ? ?Date/Time:  Sunday December 03 2021 09:16:30 EDT ?Ventricular Rate:  55 ?PR Interval:  234 ?QRS Duration: 147 ?QT Interval:  484 ?QTC Calculation: 463 ?R Axis:   -8 ?Text Interpretation: Sinus rhythm Prolonged PR interval , increased since last tracing Right bundle branch block Confirmed by Dorie Rank [phone]) on 12/03/2021 9:33:39 AM ? ?Radiology ?CT HEAD WO CONTRAST ? ?Result Date: 12/03/2021 ?CLINICAL DATA:  Vertigo.  Hypertension. EXAM: CT HEAD WITHOUT CONTRAST TECHNIQUE: Contiguous axial images were obtained from the base of the skull through the vertex without intravenous contrast. RADIATION DOSE REDUCTION: This exam was performed according to the departmental dose-optimization program which includes automated exposure control, adjustment of the mA and/or kV according to patient size and/or use of iterative reconstruction technique. COMPARISON:  None. FINDINGS: Brain: There is no evidence for acute hemorrhage, hydrocephalus, mass lesion, or abnormal extra-axial fluid collection. No definite CT evidence for acute infarction. Vascular: No hyperdense vessel or unexpected calcification. Skull: No evidence for fracture. No worrisome lytic or sclerotic lesion. Sinuses/Orbits: The visualized paranasal sinuses and mastoid air cells are clear. Visualized portions of the  globes and intraorbital fat are unremarkable. Other: None. IMPRESSION: No acute intracranial abnormality. Electronically Signed   By: Misty Stanley M.D.   On: 12/03/2021 10:21   ? ?Procedures ?Procedures  ? ? ?Medications Ordered in ED ?Medications  ?sodium chloride 0.9 % bolus 500 mL (0 mLs Intravenous Stopped 12/03/21 1127)  ?  Followed by  ?0.9 %  sodium chloride infusion (100 mL/hr Intravenous New Bag/Given 12/03/21 1122)  ?labetalol (NORMODYNE) injection 10 mg (10 mg Intravenous Given 12/03/21 1005)  ?labetalol (NORMODYNE) injection 10 mg (10 mg Intravenous Given 12/03/21 1134)  ? ? ?ED Course/ Medical Decision Making/ A&P ?Clinical Course as of 12/03/21 1218  ?Nancy Fetter Dec 03, 4032  ?7425 Stroke certainly a consideration but no focal deficits  on exam right now to warrant code stroke activation and transfer to another facility.  We will start with head CT proceed with further work-up [JK]  ?[facility] ?Head CT without acute abnormality [JK]  ?1122 Comprehensive metabolic panel(!) ?Normal [JK]  ?1122 CBC ?Normal [JK]  ?1129 Blood pressure slightly decreased at 204/97.  Will give 1 more dose of labetalol [JK]  ?1216 Discussed with Olevia Bowens regarding admission. [JK]  ?  ?Clinical Course User Index ?[JK] Dorie Rank, MD  ? ?                        ?Medical Decision Making ?Amount and/or Complexity of Data Reviewed ?Labs: ordered. Decision-making details documented in ED Course. ?Radiology: ordered. Decision-making details documented in ED Course. ? ?Risk ?Prescription drug management. ?Decision regarding hospitalization. ? ?Patient presented to the ED for evaluation of vertigo and dizziness.  Concerned about possible central cause/acute stroke/hypertensive emergency for his symptoms.  Patient also noted to be severely hypertensive with blood pressure was 217/121.  Patient normally does not have significantly elevated blood pressure.  Patient CT scan does not show any signs of acute hemorrhage.  No significant  laboratory abnormalities.  He has been started on IV labetalol.  I will give an additional dose with his persistent hypertension.  At this point I feel the patient requires admission to the hospital possible neurology cons

## 2021-12-03 NOTE — Assessment & Plan Note (Addendum)
Suspect BPPV ?New onset, room spinning this morning ?3 occasions, lasted seconds to about Roy Moody minute ?Unsteady when it happens, but was able to walk to car and get to bed without issue after it resolved ?No changes in hearing ?Given hypertensive urgency, will follow MRI brain -> without acute abnormality ?Therapy for vestibular rehab -> recommending outpatient PT ?Meclizine prn ? ? ?

## 2021-12-04 ENCOUNTER — Observation Stay (HOSPITAL_COMMUNITY): Payer: Medicare PPO

## 2021-12-04 DIAGNOSIS — G43909 Migraine, unspecified, not intractable, without status migrainosus: Secondary | ICD-10-CM | POA: Diagnosis present

## 2021-12-04 DIAGNOSIS — Z91048 Other nonmedicinal substance allergy status: Secondary | ICD-10-CM | POA: Diagnosis not present

## 2021-12-04 DIAGNOSIS — H8110 Benign paroxysmal vertigo, unspecified ear: Secondary | ICD-10-CM | POA: Diagnosis present

## 2021-12-04 DIAGNOSIS — Z20822 Contact with and (suspected) exposure to covid-19: Secondary | ICD-10-CM | POA: Diagnosis present

## 2021-12-04 DIAGNOSIS — I160 Hypertensive urgency: Secondary | ICD-10-CM

## 2021-12-04 DIAGNOSIS — R001 Bradycardia, unspecified: Secondary | ICD-10-CM | POA: Diagnosis present

## 2021-12-04 DIAGNOSIS — H02401 Unspecified ptosis of right eyelid: Secondary | ICD-10-CM | POA: Diagnosis present

## 2021-12-04 DIAGNOSIS — R519 Headache, unspecified: Secondary | ICD-10-CM

## 2021-12-04 DIAGNOSIS — Z85828 Personal history of other malignant neoplasm of skin: Secondary | ICD-10-CM | POA: Diagnosis not present

## 2021-12-04 DIAGNOSIS — R42 Dizziness and giddiness: Secondary | ICD-10-CM | POA: Diagnosis present

## 2021-12-04 DIAGNOSIS — Z888 Allergy status to other drugs, medicaments and biological substances status: Secondary | ICD-10-CM | POA: Diagnosis not present

## 2021-12-04 DIAGNOSIS — H409 Unspecified glaucoma: Secondary | ICD-10-CM | POA: Diagnosis present

## 2021-12-04 DIAGNOSIS — Z91040 Latex allergy status: Secondary | ICD-10-CM | POA: Diagnosis not present

## 2021-12-04 DIAGNOSIS — I10 Essential (primary) hypertension: Secondary | ICD-10-CM | POA: Diagnosis present

## 2021-12-04 DIAGNOSIS — I951 Orthostatic hypotension: Secondary | ICD-10-CM | POA: Diagnosis present

## 2021-12-04 DIAGNOSIS — Z79899 Other long term (current) drug therapy: Secondary | ICD-10-CM | POA: Diagnosis not present

## 2021-12-04 LAB — CBC
HCT: 41.7 % (ref 39.0–52.0)
Hemoglobin: 14.1 g/dL (ref 13.0–17.0)
MCH: 30.1 pg (ref 26.0–34.0)
MCHC: 33.8 g/dL (ref 30.0–36.0)
MCV: 89.1 fL (ref 80.0–100.0)
Platelets: 212 10*3/uL (ref 150–400)
RBC: 4.68 MIL/uL (ref 4.22–5.81)
RDW: 13.1 % (ref 11.5–15.5)
WBC: 8.6 10*3/uL (ref 4.0–10.5)
nRBC: 0 % (ref 0.0–0.2)

## 2021-12-04 LAB — COMPREHENSIVE METABOLIC PANEL
ALT: 15 U/L (ref 0–44)
AST: 19 U/L (ref 15–41)
Albumin: 3.4 g/dL — ABNORMAL LOW (ref 3.5–5.0)
Alkaline Phosphatase: 57 U/L (ref 38–126)
Anion gap: 6 (ref 5–15)
BUN: 18 mg/dL (ref 8–23)
CO2: 25 mmol/L (ref 22–32)
Calcium: 8.7 mg/dL — ABNORMAL LOW (ref 8.9–10.3)
Chloride: 109 mmol/L (ref 98–111)
Creatinine, Ser: 0.61 mg/dL (ref 0.61–1.24)
GFR, Estimated: >60 mL/min (ref >60)
Glucose, Bld: 118 mg/dL — ABNORMAL HIGH (ref 70–99)
Potassium: 3.7 mmol/L (ref 3.5–5.1)
Sodium: 140 mmol/L (ref 135–145)
Total Bilirubin: 0.6 mg/dL (ref 0.3–1.2)
Total Protein: 5.6 g/dL — ABNORMAL LOW (ref 6.5–8.1)

## 2021-12-04 LAB — ECHOCARDIOGRAM COMPLETE
AR max vel: 2.74 cm2
AV Area VTI: 3.04 cm2
AV Area mean vel: 2.69 cm2
AV Mean grad: 4 mmHg
AV Peak grad: 7.8 mmHg
Ao pk vel: 1.4 m/s
Area-P 1/2: 1.83 cm2
Height: 69 in
S' Lateral: 2.8 cm
Weight: 2456 oz

## 2021-12-04 LAB — TSH: TSH: 1.859 u[IU]/mL (ref 0.350–4.500)

## 2021-12-04 MED ORDER — IBUPROFEN 200 MG PO TABS
400.0000 mg | ORAL_TABLET | Freq: Once | ORAL | Status: AC
  Administered 2021-12-04: 400 mg via ORAL
  Filled 2021-12-04: qty 2

## 2021-12-04 MED ORDER — LABETALOL HCL 200 MG PO TABS
400.0000 mg | ORAL_TABLET | Freq: Two times a day (BID) | ORAL | Status: DC
  Administered 2021-12-04 – 2021-12-05 (×3): 400 mg via ORAL
  Filled 2021-12-04 (×3): qty 2

## 2021-12-04 MED ORDER — LACTATED RINGERS IV BOLUS
500.0000 mL | Freq: Once | INTRAVENOUS | Status: AC
  Administered 2021-12-04: 500 mL via INTRAVENOUS

## 2021-12-04 MED ORDER — LABETALOL HCL 200 MG PO TABS
200.0000 mg | ORAL_TABLET | Freq: Every day | ORAL | Status: DC
  Administered 2021-12-04: 200 mg via ORAL
  Filled 2021-12-04: qty 1

## 2021-12-04 MED ORDER — PROCHLORPERAZINE EDISYLATE 10 MG/2ML IJ SOLN
10.0000 mg | Freq: Once | INTRAMUSCULAR | Status: AC
  Administered 2021-12-04: 10 mg via INTRAVENOUS
  Filled 2021-12-04: qty 2

## 2021-12-04 MED ORDER — KETOROLAC TROMETHAMINE 15 MG/ML IJ SOLN
15.0000 mg | Freq: Once | INTRAMUSCULAR | Status: AC
  Administered 2021-12-04: 15 mg via INTRAVENOUS
  Filled 2021-12-04: qty 1

## 2021-12-04 MED ORDER — AMLODIPINE BESYLATE 5 MG PO TABS
5.0000 mg | ORAL_TABLET | Freq: Once | ORAL | Status: AC
  Administered 2021-12-04: 5 mg via ORAL
  Filled 2021-12-04: qty 1

## 2021-12-04 MED ORDER — HYDRALAZINE HCL 20 MG/ML IJ SOLN: 5.0000 mg | Freq: Once | INTRAMUSCULAR | Status: DC | PRN

## 2021-12-04 MED ORDER — HYDROCHLOROTHIAZIDE 25 MG PO TABS
25.0000 mg | ORAL_TABLET | Freq: Every day | ORAL | Status: DC
  Administered 2021-12-05: 25 mg via ORAL
  Filled 2021-12-04: qty 1

## 2021-12-04 MED ORDER — DIPHENHYDRAMINE HCL 25 MG PO CAPS
25.0000 mg | ORAL_CAPSULE | Freq: Once | ORAL | Status: AC
  Administered 2021-12-04: 25 mg via ORAL
  Filled 2021-12-04: qty 1

## 2021-12-04 MED ORDER — HYDROCHLOROTHIAZIDE 12.5 MG PO TABS
12.5000 mg | ORAL_TABLET | Freq: Once | ORAL | Status: AC
  Administered 2021-12-04: 12.5 mg via ORAL
  Filled 2021-12-04: qty 1

## 2021-12-04 MED ORDER — LOSARTAN POTASSIUM 25 MG PO TABS
25.0000 mg | ORAL_TABLET | Freq: Every day | ORAL | Status: DC
  Administered 2021-12-04: 25 mg via ORAL
  Filled 2021-12-04: qty 1

## 2021-12-04 MED ORDER — AMLODIPINE BESYLATE 10 MG PO TABS
10.0000 mg | ORAL_TABLET | Freq: Every day | ORAL | Status: DC
  Administered 2021-12-05: 10 mg via ORAL
  Filled 2021-12-04: qty 1

## 2021-12-04 NOTE — Hospital Course (Addendum)
Ghali Morissette Linhares is Latausha Flamm 81 y.o. male with medical history significant of HTN, glaucoma, migraine HA who presented to [facility] with new onset vertigo.  Hospitalization also complicated by hypertensive urgency.  He had MRI which was negative.  Vertigo likely BPPV.  BP remained elevated early in hospital stay, aggressive uptitration of BP meds.  He was severely orthostatic on 4/11, BP meds held and IVF given.  His blood pressure had improved in the afternoon and he was asymptomatic.  He was discharged with plan for outpatient follow up. ? ?See below for additional details ?  ?

## 2021-12-04 NOTE — Progress Notes (Signed)
Echocardiogram ?2D Echocardiogram has been performed. ? ?Roy Moody ?12/04/2021, 11:28 AM ?

## 2021-12-04 NOTE — Evaluation (Signed)
Physical Therapy Evaluation ?Patient Details ?Name: Roy Moody ?MRN: [id number] ?DOB: April 29, 1941 ?Today's Date: 12/04/2021 ? ?History of Present Illness ? Patient is 81 y.o. male presented to ED for dizziness, vertigo, and hypertension. PMH significant for HTN, OA, glaucoma, and migraine HA. ?  ?Clinical Impression ? Roy Moody is 81 y.o. male admitted with above HPI and diagnosis. Patient is currently limited by functional impairments below (see PT problem list). Patient lives with his wife and is independent at baseline. Patient's BP too elevated for OOB mobility with PT (see below) but vestibular assessment performed in supine. Pt denied sensation of dizziness, lightheadedness, nausea throughout. HINTS testing completed: head impulse impaired on Lt, Rt beating nystagmus present in bil gaze and fixed central gaze, test of skew negative. The results may be indicative of a Lt vestibular hypofunction. RN and MD notified of limitations to mobilize due to elevated BP, pt also reported a headache. Patient will benefit from continued skilled PT interventions to address impairments and progress independence with mobility, recommending OPPT for vestibular rehab if symptoms persist. Acute PT will follow and progress as able.    ?   ? ?Recommendations for follow up therapy are one component of a multi-disciplinary discharge planning process, led by the attending physician.  Recommendations may be updated based on patient status, additional functional criteria and insurance authorization. ? ?Follow Up Recommendations Outpatient PT ? ?  ?Assistance Recommended at Discharge Intermittent Supervision/Assistance  ?Patient can return home with the following ? A little help with walking and/or transfers;A little help with bathing/dressing/bathroom;Assistance with cooking/housework;Direct supervision/assist for medications management;Assist for transportation;Help with stairs or ramp for entrance ? ?  ?Equipment  Recommendations None recommended by PT (TBA)  ?Recommendations for Other Services ?    ?  ?Functional Status Assessment Patient has had a recent decline in their functional status and demonstrates the ability to make significant improvements in function in a reasonable and predictable amount of time.  ? ?  ?Precautions / Restrictions Precautions ?Precautions: Fall ?Restrictions ?Weight Bearing Restrictions: No  ? ?  ? ? Vestibular Assessment - 12/04/21 0001   ? ?  ? Vestibular Assessment  ? General Observation Rt upper lid dropping - hx for about 1 year. Rt pupil slightly more dilated than Lt.   ?  ? Symptom Behavior  ? Subjective history of current problem He noticed it around 430 when he got up.  He went to the bathroom, then was going to lay down for a few more minutes and when turning to his sided developed sensation of the room spinning.  Lasted about 15 seconds.  He subsequently had 2 more episodes about a minute and a half each when he was up and felt unsteady during that.  He took his BP and noticed it was high so he called his doctor who advised him to go to the ED.   ? Type of Dizziness  Spinning;"World moves";Vertigo;Diplopia   ? Frequency of Dizziness Intermittent   ? Duration of Dizziness 15-60 seconds   ? Symptom Nature Spontaneous;Intermittent   ? Relieving Factors Not applicable   ? Progression of Symptoms Better   ? History of similar episodes none   ?  ? Oculomotor Exam  ? Oculomotor Alignment Normal   ? Ocular ROM WNL   ? Spontaneous Right beating nystagmus   ? Gaze-induced  Right beating nystagmus with R gaze;Right beating nystagmus with L gaze   ? Head shaking Horizontal R beating nystagmus   ? Smooth Pursuits Intact   ?  Saccades Intact   ?  ? Oculomotor Exam-Fixation Suppressed   ? Left Head Impulse Impaired with catch up saccade and Rt beat   ? Right Head Impulse Intact   ?  ? Positional Testing  ? Horizontal Canal Testing Horizontal Canal Right;Horizontal Canal Left   ?  ? Horizontal Canal  Right  ? Horizontal Canal Right Duration deniess dizziness   ? Horizontal Canal Right Symptoms Geotrophic;Nystagmus   pt gazing  ?  ? Horizontal Canal Left  ? Horizontal Canal Left Duration denies dizziness   ? Horizontal Canal Left Symptoms Ageotrophic;Nystagmus   less severe Rt  ? ?  ?  ? ?  ? ? ?Mobility ? Bed Mobility ?  ?  ?  ?  ?  ?  ?  ?General bed mobility comments: defferred OOB due to elevated BP ?  ? ?Transfers ?  ?  ?  ?  ?  ?  ?  ?  ?  ?  ?  ? ?Ambulation/Gait ?  ?  ?  ?  ?  ?  ?  ?  ? ?Stairs ?  ?  ?  ?  ?  ? ?Wheelchair Mobility ?  ? ?Modified Rankin (Stroke Patients Only) ?  ? ?  ? ?Balance   ?  ?  ?  ?  ?  ?  ?  ?  ?  ?  ?  ?  ?  ?  ?  ?  ?  ?  ?   ? ? ? ?Pertinent Vitals/Pain Pain Assessment ?Pain Assessment: 0-10 ?Pain Score: 6  ?Pain Location: head ?Pain Descriptors / Indicators: Headache ?Pain Intervention(s): Limited activity within patient's tolerance, Monitored during session  ? ? ?Home Living Family/patient expects to be discharged to:: Private residence ?Living Arrangements: Spouse/significant other ?Available Help at Discharge: Family ?Type of Home: House ?Home Access: Level entry ?  ?  ?  ?Home Layout: One level ?Home Equipment: Toilet riser ?Additional Comments: pt lives at [facility] in [place]  ?  ?Prior Function Prior Level of Function : Independent/Modified Independent ?  ?  ?  ?  ?  ?  ?Mobility Comments: enjoys walking 3-4 miles/day and hiking ?  ?  ? ? ?Hand Dominance  ? Dominant Hand: Right ? ?  ?Extremity/Trunk Assessment  ? Upper Extremity Assessment ?Upper Extremity Assessment: Overall WFL for tasks assessed ?  ? ?Lower Extremity Assessment ?Lower Extremity Assessment: Overall WFL for tasks assessed ?  ? ?Cervical / Trunk Assessment ?Cervical / Trunk Assessment: Normal  ?Communication  ? Communication: No difficulties  ?Cognition Arousal/Alertness: Awake/alert ?Behavior During Therapy: [facility] for tasks assessed/performed ?Overall Cognitive Status: Within Functional Limits for tasks  assessed ?  ?  ?  ?  ?  ?  ?  ?  ?  ?  ?  ?  ?  ?  ?  ?  ?  ?  ?  ? ?  ?General Comments   ? ?  ?Exercises    ? ?Assessment/Plan  ?  ?PT Assessment Patient needs continued PT services  ?PT Problem List Decreased strength;Decreased activity tolerance;Decreased balance;Decreased mobility;Decreased knowledge of precautions;Impaired sensation ? ?   ?  ?PT Treatment Interventions DME instruction;Gait training;Stair training;Functional mobility training;Therapeutic activities;Therapeutic exercise;Balance training;Neuromuscular re-education;Patient/family education   ? ?PT Goals (Current goals can be found in the Care Plan section)  ?Acute Rehab PT Goals ?Patient Stated Goal: get home ?PT Goal Formulation: With patient ?Time For Goal Achievement: 12/11/21 ?Potential to Achieve Goals: Good ? ?  ?  Frequency Min 3X/week ?  ? ? ?Co-evaluation   ?  ?  ?  ?  ? ? ?  ?AM-PAC PT "6 Clicks" Mobility  ?Outcome Measure Help needed turning from your back to your side while in a flat bed without using bedrails?: None ?Help needed moving from lying on your back to sitting on the side of a flat bed without using bedrails?: None ?Help needed moving to and from a bed to a chair (including a wheelchair)?: A Little ?Help needed standing up from a chair using your arms (e.g., wheelchair or bedside chair)?: A Little ?Help needed to walk in hospital room?: A Little ?Help needed climbing 3-5 steps with a railing? : A Lot ?6 Click Score: 19 ? ?  ?End of Session   ?Activity Tolerance: Patient tolerated treatment well ?Patient left: in bed;with call bell/phone within reach;with family/visitor present ?Nurse Communication: Mobility status;Other (comment) (BP elevated) ?PT Visit Diagnosis: Unsteadiness on feet (R26.81);Muscle weakness (generalized) (M62.81);Difficulty in walking, not elsewhere classified (R26.2);Dizziness and giddiness (R42) ?  ? ?Time: 0981-1914 ?PT Time Calculation (min) (ACUTE ONLY): 29 min ? ? ?Charges:   PT Evaluation ?$PT Eval  Low Complexity: 1 Low ?PT Treatments ?$Physical Performance Test: 8-22 mins ?  ?   ? ? ?Gwynneth Albright PT, DPT ?Acute Rehabilitation Services ?Office [phone] ?Pager [phone]  ? ?Roy Moody ?12/05/18

## 2021-12-04 NOTE — TOC Initial Note (Signed)
Transition of Care (TOC) - Initial/Assessment Note  ? ? ?Patient Details  ?Name: Roy Moody ?MRN: [id number] ?Date of Birth: 03-09-1941 ? ?Transition of Care (TOC) CM/SW Contact:    ?Tawanna Cooler, RN ?Phone Number: ?12/04/2021, 9:34 AM ? ?Clinical Narrative:                 ? ? ?Transition of Care Department (TOC) has reviewed patient and no TOC needs have been identified at this time. We will continue to monitor patient advancement through interdisciplinary progression rounds. If new patient transition needs arise, please place a TOC consult. ? ?Expected Discharge Plan: Home/Self Care ?Barriers to Discharge: Continued Medical Work up ? ? ?Expected Discharge Plan and Services ?Expected Discharge Plan: Home/Self Care ?  ?   ?Living arrangements for the past 2 months: [facility] ?                ?  ?Prior Living Arrangements/Services ?Living arrangements for the past 2 months: [facility] ?Lives with:: Spouse ?Patient language and need for interpreter reviewed:: Yes ?       ?Need for Family Participation in Patient Care: Yes (Comment) ?Care giver support system in place?: Yes (comment) ?  ?Criminal Activity/Legal Involvement Pertinent to Current Situation/Hospitalization: No - Comment as needed ? ?Activities of Daily Living ?Home Assistive Devices/Equipment: None ?ADL Screening (condition at time of admission) ?Patient's cognitive ability adequate to safely complete daily activities?: Yes ?Is the patient deaf or have difficulty hearing?: No ?Does the patient have difficulty seeing, even when wearing glasses/contacts?: No ?Does the patient have difficulty concentrating, remembering, or making decisions?: No ?Patient able to express need for assistance with ADLs?: No ?Does the patient have difficulty dressing or bathing?: No ?Independently performs ADLs?: Yes (appropriate for developmental age) ?Does the patient have difficulty walking or climbing stairs?: No ?Weakness of Legs: None ?Weakness of  Arms/Hands: None ? ?Emotional Assessment ?  ?Alcohol / Substance Use: Not Applicable ?Psych Involvement: No (comment) ? ?Admission diagnosis:  Dizziness [R42] ?Hypertensive urgency [I16.0] ?Vertigo [R42] ?Patient Active Problem List  ? Diagnosis Date Noted  ? Hypertensive urgency 12/03/2021  ? Vertigo 12/03/2021  ? Sinus bradycardia 12/03/2021  ? Overactive bladder 10/27/2021  ? Pain in limb 10/27/2021  ? Encounter for screening for other disorder 01/08/2019  ? Lipoma 01/08/2019  ? Polyp of colon 07/17/2018  ? Basal cell carcinoma of skin 07/11/2017  ? Varicose veins of lower extremity 07/11/2017  ? Inguinal hernia 12/24/2016  ? Hematuria 07/03/2016  ? Glaucoma 12/23/2014  ? Proteinuria 12/23/2014  ? Allergic rhinitis 12/19/2012  ? Underimmunization status 06/24/2012  ? Macular degeneration 11/30/2010  ? Disorder of pancreas 11/28/2009  ? Hyperglycemia 11/28/2009  ? Benign prostatic hyperplasia with lower urinary tract symptoms 10/25/2009  ? Dermatitis 10/25/2009  ? Essential hypertension 10/25/2009  ? Migraine 10/25/2009  ? Osteoarthritis 10/25/2009  ? ?PCP:  Shon Baton, MD ?Pharmacy:   ?[facility] ?[place] [address] ?Phone: [phone] Fax: [phone] ? ?

## 2021-12-04 NOTE — Assessment & Plan Note (Addendum)
Typically he notes HA with rain (past few days), but today no rain, it's continued, is intractable.  Cant use triptan with significantly elevated BP -> will caution against this at discharge for now.  No vision changes, CP, SOB. ?Improved after headache cocktail ?

## 2021-12-04 NOTE — Progress Notes (Signed)
?PROGRESS NOTE ? ? ? Roy Moody  CVE:[id number] DOB: 1940/09/19 DOA: 12/03/2021 ?PCP: Shon Baton, MD  ?Chief Complaint  ?Patient presents with  ? Dizziness  ?  Hypertension  ? ? ?Brief Narrative:  ?[name] is Roy Moody 81 y.o. male with medical history significant of HTN, glaucoma, migraine HA who presented to [facility] with new onset vertigo. ?   ? ? ?Assessment & Plan: ?  ?Principal Problem: ?  Vertigo ?Active Problems: ?  Hypertensive urgency ?  Intractable headache ?  Sinus bradycardia ? ? ?Assessment and Plan: ?* Vertigo ?New onset, room spinning this morning ?3 occasions, lasted seconds to about Roy Moody minute ?Unsteady when it happens, but was able to walk to car and get to bed without issue after it resolved ?No changes in hearing ?Given increased BP, will follow MRI brain  ?Therapy for vestibular rehab -> recommending outpatient PT ?Meclizine prn ? ? ? ?Intractable headache ?Typically he notes HA with rain (past few days), but today no rain, it's continued, is intractable.  Cant use triptan with significantly elevated BP.  No vision changes, CP, SOB. ?Trial headache cocktail. ? ?Hypertensive urgency ?He continues to have significantly elevated Bps with Letoya Stallone headache as well ?Continue prn hydral for SBP >180 ?Continue to adjust and add oral meds as tolerated ?Continue labetalol 400 mg qam and 400 mg qpm with 200 mg at lunch (lunch pill is prn SBP>150 at home) ?Add HCTZ (uptitrate), add amlodipine, add losartan ?Needs secondary HTN workup ?Normal TSH ?Pending renal artery [place] ?Will follow renin/aldo ?Additional w/u prn ? ?Sinus bradycardia ?Prolonged PR, sinus ?Holding parameters for labetalol  ? ? ?DVT prophylaxis: lovenox ?Code Status: full ?Family Communication: none ?Disposition:  ? ?Status is: Observation ?The patient remains OBS appropriate and will d/c before 2 midnights. ?  ?Consultants:  ?none ? ?Procedures:  ?Echo ?IMPRESSIONS  ? ? ? 1. Left ventricular ejection fraction, by estimation,  is 55 to 60%. The  ?left ventricle has normal function. The left ventricle has no regional  ?wall motion abnormalities. There is mild concentric left ventricular  ?hypertrophy. Left ventricular diastolic  ?parameters are consistent with Grade I diastolic dysfunction (impaired  ?relaxation).  ? 2. Right ventricular systolic function is normal. The right ventricular  ?size is normal. There is normal pulmonary artery systolic pressure. The  ?estimated right ventricular systolic pressure is 02.5 mmHg.  ? 3. The mitral valve is normal in structure. Mild mitral valve  ?regurgitation.  ? 4. The aortic valve is tricuspid. There is mild calcification of the  ?aortic valve. There is mild thickening of the aortic valve. Aortic valve  ?regurgitation is trivial. Aortic valve sclerosis/calcification is present,  ?without any evidence of aortic  ?stenosis.  ? ?Comparison(s): No prior Echocardiogram.  ? ?Antimicrobials:  ?Anti-infectives (From admission, onward)  ? ? None  ? ?  ? ? ?Subjective: ?No new complaints ?Still notes intractable HA ? ?Objective: ?Vitals:  ? 12/04/21 1347 12/04/21 1353 12/04/21 1519 12/04/21 1700  ?BP: (!) 192/101 (!) 191/104 (!) 171/92 (!) 198/102  ?Pulse: 70 69 70 72  ?Resp:   18   ?Temp:   98.2 ?F (36.8 ?C)   ?TempSrc:   Oral   ?SpO2:   94%   ?Weight:      ?Height:      ? ? ?Intake/Output Summary (Last 24 hours) at 12/04/2021 1757 ?Last data filed at 12/04/2021 1000 ?Gross per 24 hour  ?Intake 1776.85 ml  ?Output 2200 ml  ?Net -423.15 ml  ? ?  Filed Weights  ? 12/03/21 4098 12/03/21 1621  ?Weight: 70.3 kg 69.6 kg  ? ? ?Examination: ? ?General exam: Appears calm and comfortable  ?Respiratory system: unlabored ?Cardiovascular system: RRR ?Gastrointestinal system: Abdomen is nondistended, soft and nontender ?Central nervous system: Alert and oriented. No focal neurological deficits. ?Extremities: no LEE ?Skin: No rashes, lesions or ulcers ?Psychiatry: Judgement and insight appear normal. Mood & affect  appropriate.  ? ? ? ?Data Reviewed: I have personally reviewed following labs and imaging studies ? ?CBC: ?Recent Labs  ?Lab 12/03/21 ?0950 12/04/21 ?0319  ?WBC 8.8 8.6  ?NEUTROABS 6.2  --   ?HGB 15.0 14.1  ?HCT 44.8 41.7  ?MCV 87.7 89.1  ?PLT 213 212  ? ? ?Basic Metabolic Panel: ?Recent Labs  ?Lab 12/03/21 ?0950 12/04/21 ?0319  ?NA 140 140  ?K 4.4 3.7  ?CL 103 109  ?CO2 28 25  ?GLUCOSE 133* 118*  ?BUN 26* 18  ?CREATININE 0.77 0.61  ?CALCIUM 9.5 8.7*  ? ? ?GFR: ?Estimated Creatinine Clearance: 72.5 mL/min (by C-G formula based on SCr of 0.61 mg/dL). ? ?Liver Function Tests: ?Recent Labs  ?Lab 12/03/21 ?0950 12/04/21 ?0319  ?AST 19 19  ?ALT 15 15  ?ALKPHOS 67 57  ?BILITOT 0.7 0.6  ?PROT 6.9 5.6*  ?ALBUMIN 4.4 3.4*  ? ? ?CBG: ?No results for input(s): GLUCAP in the last 168 hours. ? ? ?Recent Results (from the past 240 hour(s))  ?Resp Panel by RT-PCR (Flu Gabrial Poppell&B, Covid) Nasopharyngeal Swab     Status: None  ? Collection Time: 12/03/21  9:50 AM  ? Specimen: Nasopharyngeal Swab; Nasopharyngeal(NP) swabs in vial transport medium  ?Result Value Ref Range Status  ? SARS Coronavirus 2 by RT PCR NEGATIVE NEGATIVE Final  ?  Comment: (NOTE) ?SARS-CoV-2 target nucleic acids are NOT DETECTED. ? ?The SARS-CoV-2 RNA is generally detectable in upper respiratory ?specimens during the acute phase of infection. The lowest ?concentration of SARS-CoV-2 viral copies this assay can detect is ?138 copies/mL. Chaska Hagger negative result does not preclude SARS-Cov-2 ?infection and should not be used as the sole basis for treatment or ?other patient management decisions. Kyleena Scheirer negative result may occur with  ?improper specimen collection/handling, submission of specimen other ?than nasopharyngeal swab, presence of viral mutation(s) within the ?areas targeted by this assay, and inadequate number of viral ?copies(<138 copies/mL). Adelia Baptista negative result must be combined with ?clinical observations, patient history, and epidemiological ?information. The expected  result is Negative. ? ?Fact Sheet for Patients:  ?EntrepreneurPulse.com.au ? ?Fact Sheet for Healthcare Providers:  ?IncredibleEmployment.be ? ?This test is no t yet approved or cleared by the [place] FDA and  ?has been authorized for detection and/or diagnosis of SARS-CoV-2 by ?FDA under an Emergency Use Authorization (EUA). This EUA will remain  ?in effect (meaning this test can be used) for the duration of the ?COVID-19 declaration under Section 564(b)(1) of the Act, 21 ?U.S.C.section 360bbb-3(b)(1), unless the authorization is terminated  ?or revoked sooner.  ? ? ?  ? Influenza Draven Laine by PCR NEGATIVE NEGATIVE Final  ? Influenza B by PCR NEGATIVE NEGATIVE Final  ?  Comment: (NOTE) ?The Xpert Xpress SARS-CoV-2/FLU/RSV plus assay is intended as an aid ?in the diagnosis of influenza from Nasopharyngeal swab specimens and ?should not be used as Delayne Sanzo sole basis for treatment. Nasal washings and ?aspirates are unacceptable for Xpert Xpress SARS-CoV-2/FLU/RSV ?testing. ? ?Fact Sheet for Patients: ?EntrepreneurPulse.com.au ? ?Fact Sheet for Healthcare Providers: ?IncredibleEmployment.be ? ?This test is not yet approved or cleared by the [place]  FDA and ?has been authorized for detection and/or diagnosis of SARS-CoV-2 by ?FDA under an Emergency Use Authorization (EUA). This EUA will remain ?in effect (meaning this test can be used) for the duration of the ?COVID-19 declaration under Section 564(b)(1) of the Act, 21 U.S.C. ?section 360bbb-3(b)(1), unless the authorization is terminated or ?revoked. ? ?Performed at [facility], [address] ?[place], [place] [address] ?  ?  ? ? ? ? ? ?Radiology Studies: ?CT HEAD WO CONTRAST ? ?Result Date: 12/03/2021 ?CLINICAL DATA:  Vertigo.  Hypertension. EXAM: CT HEAD WITHOUT CONTRAST TECHNIQUE: Contiguous axial images were obtained from the base of the skull through the vertex without  intravenous contrast. RADIATION DOSE REDUCTION: This exam was performed according to the departmental dose-optimization program which includes automated exposure control, adjustment of the mA and/or kV according to patient

## 2021-12-05 ENCOUNTER — Inpatient Hospital Stay (HOSPITAL_COMMUNITY): Payer: Medicare PPO

## 2021-12-05 ENCOUNTER — Encounter (HOSPITAL_COMMUNITY): Payer: Self-pay | Admitting: Family Medicine

## 2021-12-05 DIAGNOSIS — I160 Hypertensive urgency: Secondary | ICD-10-CM

## 2021-12-05 DIAGNOSIS — I951 Orthostatic hypotension: Secondary | ICD-10-CM

## 2021-12-05 DIAGNOSIS — R42 Dizziness and giddiness: Secondary | ICD-10-CM | POA: Diagnosis not present

## 2021-12-05 LAB — COMPREHENSIVE METABOLIC PANEL
ALT: 15 U/L (ref 0–44)
AST: 17 U/L (ref 15–41)
Albumin: 3.5 g/dL (ref 3.5–5.0)
Alkaline Phosphatase: 59 U/L (ref 38–126)
Anion gap: 8 (ref 5–15)
BUN: 16 mg/dL (ref 8–23)
CO2: 24 mmol/L (ref 22–32)
Calcium: 8.9 mg/dL (ref 8.9–10.3)
Chloride: 105 mmol/L (ref 98–111)
Creatinine, Ser: 0.77 mg/dL (ref 0.61–1.24)
GFR, Estimated: >60 mL/min (ref >60)
Glucose, Bld: 110 mg/dL — ABNORMAL HIGH (ref 70–99)
Potassium: 3 mmol/L — ABNORMAL LOW (ref 3.5–5.1)
Sodium: 137 mmol/L (ref 135–145)
Total Bilirubin: 0.9 mg/dL (ref 0.3–1.2)
Total Protein: 6 g/dL — ABNORMAL LOW (ref 6.5–8.1)

## 2021-12-05 LAB — CBC WITH DIFFERENTIAL/PLATELET
Abs Immature Granulocytes: 0.02 10*3/uL (ref 0.00–0.07)
Basophils Absolute: 0.1 10*3/uL (ref 0.0–0.1)
Basophils Relative: 1 %
Eosinophils Absolute: 0.1 10*3/uL (ref 0.0–0.5)
Eosinophils Relative: 1 %
HCT: 43.3 % (ref 39.0–52.0)
Hemoglobin: 14.7 g/dL (ref 13.0–17.0)
Immature Granulocytes: 0 %
Lymphocytes Relative: 50 %
Lymphs Abs: 5.1 10*3/uL — ABNORMAL HIGH (ref 0.7–4.0)
MCH: 30 pg (ref 26.0–34.0)
MCHC: 33.9 g/dL (ref 30.0–36.0)
MCV: 88.4 fL (ref 80.0–100.0)
Monocytes Absolute: 0.8 10*3/uL (ref 0.1–1.0)
Monocytes Relative: 8 %
Neutro Abs: 4 10*3/uL (ref 1.7–7.7)
Neutrophils Relative %: 40 %
Platelets: 215 10*3/uL (ref 150–400)
RBC: 4.9 MIL/uL (ref 4.22–5.81)
RDW: 13.2 % (ref 11.5–15.5)
WBC: 10.2 10*3/uL (ref 4.0–10.5)
nRBC: 0 % (ref 0.0–0.2)

## 2021-12-05 LAB — URINALYSIS, COMPLETE (UACMP) WITH MICROSCOPIC
Bilirubin Urine: NEGATIVE
Glucose, UA: NEGATIVE mg/dL
Hgb urine dipstick: NEGATIVE
Ketones, ur: NEGATIVE mg/dL
Leukocytes,Ua: NEGATIVE
Nitrite: NEGATIVE
Protein, ur: NEGATIVE mg/dL
Specific Gravity, Urine: 1.006 (ref 1.005–1.030)
pH: 5 (ref 5.0–8.0)

## 2021-12-05 LAB — PHOSPHORUS: Phosphorus: 4.4 mg/dL (ref 2.5–4.6)

## 2021-12-05 LAB — MAGNESIUM: Magnesium: 2 mg/dL (ref 1.7–2.4)

## 2021-12-05 MED ORDER — AMLODIPINE BESYLATE 5 MG PO TABS: 5.0000 mg | ORAL_TABLET | Freq: Every day | ORAL | Status: DC

## 2021-12-05 MED ORDER — LABETALOL HCL 200 MG PO TABS: 400.0000 mg | ORAL_TABLET | Freq: Two times a day (BID) | ORAL | Status: DC

## 2021-12-05 MED ORDER — MECLIZINE HCL 25 MG PO TABS: 25.0000 mg | ORAL_TABLET | Freq: Three times a day (TID) | ORAL | 0 refills | Status: DC | PRN

## 2021-12-05 MED ORDER — POTASSIUM CHLORIDE 10 MEQ/100ML IV SOLN
10.0000 meq | INTRAVENOUS | Status: DC
  Administered 2021-12-05 (×2): 10 meq via INTRAVENOUS
  Filled 2021-12-05 (×2): qty 100

## 2021-12-05 MED ORDER — LABETALOL HCL 200 MG PO TABS: 200.0000 mg | ORAL_TABLET | Freq: Every day | ORAL | Status: DC

## 2021-12-05 MED ORDER — POTASSIUM CHLORIDE CRYS ER 20 MEQ PO TBCR
40.0000 meq | EXTENDED_RELEASE_TABLET | ORAL | Status: AC
  Administered 2021-12-05 (×2): 40 meq via ORAL
  Filled 2021-12-05 (×2): qty 2

## 2021-12-05 MED ORDER — LACTATED RINGERS IV BOLUS
1000.0000 mL | Freq: Once | INTRAVENOUS | Status: AC
  Administered 2021-12-05: 1000 mL via INTRAVENOUS

## 2021-12-05 MED ORDER — AMLODIPINE BESYLATE 5 MG PO TABS: 5.0000 mg | ORAL_TABLET | Freq: Every day | ORAL | 0 refills | Status: AC

## 2021-12-05 NOTE — Progress Notes (Signed)
Physical Therapy Treatment ?Patient Details ?Name: Roy Moody ?MRN: [id number] ?DOB: December 08, 1940 ?Today's Date: 12/05/2021 ? ? ?History of Present Illness Patient is 81 y.o. male presented to ED for dizziness, vertigo, and hypertension. PMH significant for HTN, OA, glaucoma, and migraine HA. ? ?  ?PT Comments  ? ? Attempted PT session this AM. Pt BP in normal range. BP 161/97 mmHg in supine and  HR 60 bpm. Patient was able to complete bed mobility to EOB. Vestibular assessment completed at EOB and Rt beating nystagmus noted at rest and with Rt gaze, over time nystagmus fatigued and not present at rest. Session interrupted by ultrasound arriving to complete renal artery [place]. Will follow up in afternoon to attempt further mobility.  ? ?  ?Recommendations for follow up therapy are one component of a multi-disciplinary discharge planning process, led by the attending physician.  Recommendations may be updated based on patient status, additional functional criteria and insurance authorization. ? ?Follow Up Recommendations ? Outpatient PT ?  ?  ?Assistance Recommended at Discharge Intermittent Supervision/Assistance  ?Patient can return home with the following A little help with walking and/or transfers;A little help with bathing/dressing/bathroom;Assistance with cooking/housework;Direct supervision/assist for medications management;Assist for transportation;Help with stairs or ramp for entrance ?  ?Equipment Recommendations ? None recommended by PT (TBA)  ?  ?Recommendations for Other Services   ? ? ?  ?Precautions / Restrictions Precautions ?Precautions: Fall ?Restrictions ?Weight Bearing Restrictions: No  ?  ? ? Vestibular Assessment - 12/05/21 0845   ? ?  ? Vestibular Assessment  ? General Observation Rt upper lid dropping - hx for about 1 year. Rt pupil slightly more dilated than Lt.   ?  ? Symptom Behavior  ? Subjective history of current problem not dizzy today   ? Type of Dizziness  Spinning;"World  moves";Vertigo;Diplopia   ? Frequency of Dizziness Intermittent   ? Duration of Dizziness 15-60 seconds   ? Symptom Nature Spontaneous;Intermittent   ? Relieving Factors Not applicable   ? Progression of Symptoms Better   ? History of similar episodes none   ?  ? Oculomotor Exam  ? Oculomotor Alignment Normal   ? Ocular ROM WNL   ? Spontaneous Right beating nystagmus   fatigues  ? Gaze-induced  Right beating nystagmus with R gaze   ? Smooth Pursuits Intact   ? Saccades Intact   ? ?  ?  ? ?  ? ? ?Mobility ? Bed Mobility ?Overal bed mobility: Needs Assistance ?Bed Mobility: Supine to Sit, Sit to Supine ?  ?  ?Supine to sit: Supervision, HOB elevated ?Sit to supine: Supervision ?  ?General bed mobility comments: supervision, no assist or cues needed ?  ? ?Transfers ?  ?  ?  ?  ?  ?  ?  ?  ?  ?General transfer comment: session interrupted by ultrasound and pt assisted back to bed. ?  ? ?Ambulation/Gait ?  ?  ?  ?  ?  ?  ?  ?  ? ? ?Stairs ?  ?  ?  ?  ?  ? ? ?Wheelchair Mobility ?  ? ?Modified Rankin (Stroke Patients Only) ?  ? ? ?  ?Balance Overall balance assessment: Needs assistance ?Sitting-balance support: Feet supported ?Sitting balance-Leahy Scale: Good ?  ?  ?  ?  ?  ?  ?  ?  ?  ?  ?  ?  ?  ?  ?  ?  ?  ? ?  ?Cognition Arousal/Alertness: Awake/alert ?  Behavior During Therapy: [facility] for tasks assessed/performed ?Overall Cognitive Status: Within Functional Limits for tasks assessed ?  ?  ?  ?  ?  ?  ?  ?  ?  ?  ?  ?  ?  ?  ?  ?  ?  ?  ?  ? ?  ?Exercises   ? ?  ?General Comments   ?  ?  ? ?Pertinent Vitals/Pain Pain Assessment ?Pain Assessment: No/denies pain  ? ? ?Home Living   ?  ?  ?  ?  ?  ?  ?  ?  ?  ?   ?  ?Prior Function    ?  ?  ?   ? ?PT Goals (current goals can now be found in the care plan section) Acute Rehab PT Goals ?Patient Stated Goal: get home ?PT Goal Formulation: With patient ?Time For Goal Achievement: 12/11/21 ?Potential to Achieve Goals: Good ? ?  ?Frequency ? ? ? Min 3X/week ? ? ? ?  ?PT Plan  Current plan remains appropriate  ? ? ?Co-evaluation   ?  ?  ?  ?  ? ?  ?AM-PAC PT "6 Clicks" Mobility   ?Outcome Measure ? Help needed turning from your back to your side while in a flat bed without using bedrails?: None ?Help needed moving from lying on your back to sitting on the side of a flat bed without using bedrails?: None ?Help needed moving to and from a bed to a chair (including a wheelchair)?: A Little ?Help needed standing up from a chair using your arms (e.g., wheelchair or bedside chair)?: A Little ?Help needed to walk in hospital room?: A Little ?Help needed climbing 3-5 steps with a railing? : A Lot ?6 Click Score: 19 ? ?  ?End of Session   ?Activity Tolerance: Patient tolerated treatment well ?Patient left: in bed;with call bell/phone within reach;with family/visitor present ?Nurse Communication: Mobility status ?PT Visit Diagnosis: Unsteadiness on feet (R26.81);Muscle weakness (generalized) (M62.81);Difficulty in walking, not elsewhere classified (R26.2);Dizziness and giddiness (R42) ?  ? ? ? 12/05/21 0845  ?PT Time Calculation  ?PT Start Time (ACUTE ONLY) [phone]  ?PT Stop Time (ACUTE ONLY) 0845  ?PT Time Calculation (min) (ACUTE ONLY) 8 min  ?PT General Charges  ?$$ ACUTE PT VISIT 1 Visit  ?PT Treatments  ?$Physical Performance Test 8-22 mins  ? ?      ? ?Gwynneth Albright PT, DPT ?Acute Rehabilitation Services ?Office [phone] ?Pager [phone]  ? ? ?Jacques Navy ?12/05/2021, 2:31 PM ? ?

## 2021-12-05 NOTE — Assessment & Plan Note (Addendum)
As low as 59'X systolic on 7/74 (therapy noted, "deer in headlights look", otherwise, didn't report any symptoms). ?As above, bolus, hold further BP meds today ?If improved after bolus, d/c today.  If concerns, will keep and monitor until tomorrow.  Plan for d/c only on labetalol and amlodipine 5. ? ?Repeat orthostatics this afternoon remained positive -> initially 147/83 lying, 123/81 sitting, 107/71 standing (HR 67 -> 75 -> 81).  Repeat was 146/89 sitting -> 115/78 standing (HR 68 ->82).  He was asymptomatic and eager to discharge, precautions given regarding changing position.  Walked lap around unit with RN prior to d/c. ?

## 2021-12-05 NOTE — Progress Notes (Signed)
?PROGRESS NOTE ? ? ? Roy Moody  JHE:[id number] DOB: May 21, 1941 DOA: 12/03/2021 ?PCP: Roy Baton, MD  ?Chief Complaint  ?Patient presents with  ? Dizziness  ?  Hypertension  ? ? ?Brief Narrative:  ?Roy Moody is Roy Moody 81 y.o. male with medical history significant of HTN, glaucoma, migraine HA who presented to [place] with new onset vertigo.  Hospitalization also complicated by hypertensive urgency.  He had MRI which was negative.  Vertigo likely BPPV.  BP remained elevated early in hospital stay, aggressive uptitration of BP meds.  He was severely orthostatic on 4/11, BP meds held and IVF given.  Discharge pending further improvement in BP. ? ?See below for additional details ?   ? ? ?Assessment & Plan: ?  ?Principal Problem: ?  Vertigo ?Active Problems: ?  Hypertensive urgency ?  Orthostatic hypotension ?  Sinus bradycardia ?  Intractable headache ? ? ?Assessment and Plan: ?* Vertigo ?Suspect BPPV ?New onset, room spinning this morning ?3 occasions, lasted seconds to about Roy Moody minute ?Unsteady when it happens, but was able to walk to car and get to bed without issue after it resolved ?No changes in hearing ?Given hypertensive urgency, will follow MRI brain -> without acute abnormality ?Therapy for vestibular rehab -> recommending outpatient PT ?Meclizine prn ? ? ? ?Hypertensive urgency ?BP's improved 4/11 AM to 140's - 150's  ?As we uptitrated oral meds aggressively, orthostatics were checked -> positive, with drop to as low as 18'H systolic ?He received HCTZ, amlodipine, and labetalol this AM.  Losartan was discontinued this AM.  Will hold further labetalol today.  Follow BP with bolus.  If his orthostatics improve, will plan for discharge this afternoon.  Otherwise, I think we should watch until 4/12.   ?Plan to discontinue HCTZ and losartan.  Tomorrow, will resume only amlodipine 5 mg and labetalol 400 mg qam, 200 mg in afternoon, and 400 mg qpm. ?D/c prn hydral ?Continue to adjust oral  meds as tolerated - on hold today ?Needs secondary HTN workup, pending ?Normal TSH ?Pending renal artery [place] -> no evidence of R renal artery stenosis or left renal artery stenosis (>50% stenosis in celiac artery) ?Will follow renin/aldo ?Additional w/u prn ? ?Orthostatic hypotension ?As above, bolus, hold further BP meds today ?If improved after bolus, d/c today.  If concerns, will keep and monitor until tomorrow.  Plan for d/c only on labetalol and amlodipine 5. ? ?Intractable headache ?Typically he notes HA with rain (past few days), but today no rain, it's continued, is intractable.  Cant use triptan with significantly elevated BP -> will caution against this at discharge for now.  No vision changes, CP, SOB. ?Improved after headache cocktail ? ?Sinus bradycardia ?Prolonged PR, sinus ?Rate improved, tolerating labetalol ? ? ?DVT prophylaxis: lovenox ?Code Status: full ?Family Communication: none ?Disposition:  ? ?Status is: Observation ?The patient remains OBS appropriate and will d/c before 2 midnights. ?  ?Consultants:  ?none ? ?Procedures:  ?Echo ?IMPRESSIONS  ? ? ? 1. Left ventricular ejection fraction, by estimation, is 55 to 60%. The  ?left ventricle has normal function. The left ventricle has no regional  ?wall motion abnormalities. There is mild concentric left ventricular  ?hypertrophy. Left ventricular diastolic  ?parameters are consistent with Grade I diastolic dysfunction (impaired  ?relaxation).  ? 2. Right ventricular systolic function is normal. The right ventricular  ?size is normal. There is normal pulmonary artery systolic pressure. The  ?estimated right ventricular systolic pressure is 63.1 mmHg.  ?  3. The mitral valve is normal in structure. Mild mitral valve  ?regurgitation.  ? 4. The aortic valve is tricuspid. There is mild calcification of the  ?aortic valve. There is mild thickening of the aortic valve. Aortic valve  ?regurgitation is trivial. Aortic valve sclerosis/calcification is  present,  ?without any evidence of aortic  ?stenosis.  ? ?Comparison(s): No prior Echocardiogram.  ? ?Antimicrobials:  ?Anti-infectives (From admission, onward)  ? ? None  ? ?  ? ? ?Subjective: ?HA improved ?Denies LH with standing, but with BP to 60's PT and wife said he did stare Roy Moody bit ? ?Objective: ?Vitals:  ? 12/05/21 0606 12/05/21 0820 12/05/21 1100 12/05/21 1225  ?BP: (!) 159/93 (!) 148/91 117/74 117/70  ?Pulse: 65 61 74 61  ?Resp: 20   20  ?Temp: 97.9 ?F (36.6 ?C)   98.4 ?F (36.9 ?C)  ?TempSrc: Oral   Oral  ?SpO2: 92%   93%  ?Weight:      ?Height:      ? ? ?Intake/Output Summary (Last 24 hours) at 12/05/2021 1350 ?Last data filed at 12/05/2021 1300 ?Gross per 24 hour  ?Intake 2033.19 ml  ?Output 1400 ml  ?Net 633.19 ml  ? ?Filed Weights  ? 12/03/21 0923 12/03/21 1621 12/05/21 0500  ?Weight: 70.3 kg 69.6 kg 71.7 kg  ? ? ?Examination: ? ?General: No acute distress. ?Cardiovascular: Heart sounds show [name] regular rate, and rhythm.  ?Lungs: CTAB ?Abdomen: Soft, nontender, nondistended  ?Neurological: Alert and oriented ?3. Moves all extremities ?4 . Cranial nerves II through XII grossly intact. ?Skin: Warm and dry. No rashes or lesions. ?Extremities: No clubbing or cyanosis. No edema. ? ?Data Reviewed: I have personally reviewed following labs and imaging studies ? ?CBC: ?Recent Labs  ?Lab 12/03/21 ?0950 12/04/21 ?5830 12/05/21 ?0352  ?WBC 8.8 8.6 10.2  ?NEUTROABS 6.2  --  4.0  ?HGB 15.0 14.1 14.7  ?HCT 44.8 41.7 43.3  ?MCV 87.7 89.1 88.4  ?PLT 213 212 215  ? ? ?Basic Metabolic Panel: ?Recent Labs  ?Lab 12/03/21 ?0950 12/04/21 ?9407 12/05/21 ?0352  ?NA 140 140 137  ?K 4.4 3.7 3.0*  ?CL 103 109 105  ?CO2 '28 25 24  '$ ?GLUCOSE 133* 118* 110*  ?BUN 26* 18 16  ?CREATININE 0.77 0.61 0.77  ?CALCIUM 9.5 8.7* 8.9  ?MG  --   --  2.0  ?PHOS  --   --  4.4  ? ? ?GFR: ?Estimated Creatinine Clearance: 73.6 mL/min (by C-G formula based on SCr of 0.77 mg/dL). ? ?Liver Function Tests: ?Recent Labs  ?Lab 12/03/21 ?0950 12/04/21 ?6808  12/05/21 ?0352  ?AST '19 19 17  '$ ?ALT '15 15 15  '$ ?ALKPHOS 67 57 59  ?BILITOT 0.7 0.6 0.9  ?PROT 6.9 5.6* 6.0*  ?ALBUMIN 4.4 3.4* 3.5  ? ? ?CBG: ?No results for input(s): GLUCAP in the last 168 hours. ? ? ?Recent Results (from the past 240 hour(s))  ?Resp Panel by RT-PCR (Flu Sheriff Rodenberg&B, Covid) Nasopharyngeal Swab     Status: None  ? Collection Time: 12/03/21  9:50 AM  ? Specimen: Nasopharyngeal Swab; Nasopharyngeal(NP) swabs in vial transport medium  ?Result Value Ref Range Status  ? SARS Coronavirus 2 by RT PCR NEGATIVE NEGATIVE Final  ?  Comment: (NOTE) ?SARS-CoV-2 target nucleic acids are NOT DETECTED. ? ?The SARS-CoV-2 RNA is generally detectable in upper respiratory ?specimens during the acute phase of infection. The lowest ?concentration of SARS-CoV-2 viral copies this assay can detect is ?138 copies/mL. Donique Hammonds negative result does not preclude  SARS-Cov-2 ?infection and should not be used as the sole basis for treatment or ?other patient management decisions. Briceyda Abdullah negative result may occur with  ?improper specimen collection/handling, submission of specimen other ?than nasopharyngeal swab, presence of viral mutation(s) within the ?areas targeted by this assay, and inadequate number of viral ?copies(<138 copies/mL). Finis Hendricksen negative result must be combined with ?clinical observations, patient history, and epidemiological ?information. The expected result is Negative. ? ?Fact Sheet for Patients:  ?EntrepreneurPulse.com.au ? ?Fact Sheet for Healthcare Providers:  ?IncredibleEmployment.be ? ?This test is no t yet approved or cleared by the [place] FDA and  ?has been authorized for detection and/or diagnosis of SARS-CoV-2 by ?FDA under an Emergency Use Authorization (EUA). This EUA will remain  ?in effect (meaning this test can be used) for the duration of the ?COVID-19 declaration under Section 564(b)(1) of the Act, 21 ?U.S.C.section 360bbb-3(b)(1), unless the authorization is terminated  ?or  revoked sooner.  ? ? ?  ? Influenza Jazyiah Yiu by PCR NEGATIVE NEGATIVE Final  ? Influenza B by PCR NEGATIVE NEGATIVE Final  ?  Comment: (NOTE) ?The Xpert Xpress SARS-CoV-2/FLU/RSV plus assay is intended as an aid ?in the diag

## 2021-12-05 NOTE — Discharge Summary (Signed)
Physician Discharge Summary  ?Roy Moody NID:[id number] DOB: 1941/05/11 DOA: 12/03/2021 ? ?PCP: Shon Baton, MD ? ?Admit date: 12/03/2021 ?Discharge date: 12/05/2021 ? ?Time spent: 40 minutes ? ?Recommendations for Outpatient Follow-up:  ?Follow outpatient CBC/CMP  ?Follow blood pressures outpatient -> we overshot with BP control here and he became notably orthostatic today > improved prior to discharge.  Instructed to resume only labetalol and amlodipine 5 mg tomorrow. 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 ?Discharge Diagnoses:  ?Principal Problem: ?  Vertigo ?Active Problems: ?  Hypertensive urgency ?  Orthostatic hypotension ?  Sinus bradycardia ?  Intractable headache ? ? ?Discharge Condition: stable ? ?Diet recommendation: heart healthy ? ?Filed Weights  ? 12/03/21 0923 12/03/21 1621 12/05/21 0500  ?Weight: 70.3 kg 69.6 kg 71.7 kg  ? ? ?History of present illness:  ?Roy Moody is Roy Moody 81 y.o. male with medical history significant of HTN, glaucoma, migraine HA who presented to [facility] with new onset vertigo.  Hospitalization also complicated by hypertensive urgency.  He had MRI which was negative.  Vertigo likely BPPV.  BP remained elevated early in hospital stay, aggressive uptitration of BP meds.  He was severely orthostatic on 4/11, BP meds held and IVF given.  His blood pressure had improved in the afternoon and he was asymptomatic.  He was discharged with plan for outpatient follow up. ? ?See below for additional details ?  ? ?Hospital Course:  ?Assessment and Plan: ?* Vertigo ?Suspect BPPV ?New onset, room spinning this morning ?3 occasions, lasted seconds to about N[place] Bise minute ?Unsteady when it happens, but was able to walk to car and get to bed without issue after it resolved ?No changes in  hearing ?Given hypertensive urgency, will follow MRI brain -> without acute abnormality ?Therapy for vestibular rehab -> recommending outpatient PT ?Meclizine prn ? ? ? ?Hypertensive urgency ?BP's improved 4/11 AM to 140's - 150's  ?As we uptitrated oral meds aggressively, orthostatics were checked -> positive, with drop to as low as 14'E systolic ?He received HCTZ, amlodipine, and labetalol this AM.  Losartan was discontinued this AM.  Will hold further labetalol today.  Follow BP with bolus.  If his orthostatics improve, will plan for discharge this afternoon.  Otherwise, I think we should watch until 4/12.   ?Plan to discontinue HCTZ and losartan.  Tomorrow, will resume only amlodipine 5 mg and labetalol 400 mg qam, 200 mg in afternoon, and 400 mg qpm. 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 ?Orthostatic hypotension ?As low as 31'V systolic on 4/00 (therapy noted, "deer in headlights look", otherwise, didn't report any symptoms). ?As above, bolus, hold further BP meds today ?If improved after bolus, d/c today.  If concerns, will keep and monitor until tomorrow.  Plan for d/c only on labetalol and amlodipine 5. ? ?Repeat orthostatics this afternoon remained positive -> initially 147/83 lying, 123/81 sitting, 107/71 standing (HR 67 -> 75 -> 81).  Repeat was 146/89 sitting -> 115/78 standing (HR 68 ->82).  He was asymptomatic and eager to discharge, precautions given regarding changing position.  Walked lap around unit  with RN prior to d/c. ? ?Intractable headache ?Typically he notes HA with rain (past few days), but today no rain, it's continued, is intractable.  Cant use triptan with significantly elevated BP -> will caution against this at discharge for now.   No vision changes, CP, SOB. ?Improved after headache cocktail ? ?Sinus bradycardia ?Prolonged PR, sinus ?Rate improved, tolerating labetalol ? ? ?Procedures: ?Echo ?IMPRESSIONS  ? ? ? 1. Left ventricular ejection fraction, by estimation, is 55 to 60%. The  ?left ventricle has normal function. The left ventricle has no regional  ?wall motion abnormalities. There is mild concentric left ventricular  ?hypertrophy. Left ventricular diastolic  ?parameters are consistent with Grade I diastolic dysfunction (impaired  ?relaxation).  ? 2. Right ventricular systolic function is normal. The right ventricular  ?size is normal. There is normal pulmonary artery systolic pressure. The  ?estimated right ventricular systolic pressure is 02.5 mmHg.  ? 3. The mitral valve is normal in structure. Mild mitral valve  ?regurgitation.  ? 4. The aortic valve is tricuspid. There is mild calcification of the  ?aortic valve. There is mild thickening of the aortic valve. Aortic valve  ?regurgitation is trivial. Aortic valve sclerosis/calcification is present,  ?without any evidence of aortic  ?stenosis.  ? ?Comparison(s): No prior Echocardiogram.  ? ? Summary:  ?Renal:  ?   ?Right: No evidence of right renal artery stenosis. Normal right  ?       Resisitive Index. Normal size right kidney. RRV flow present.  ?Left:  No evidence of left renal artery stenosis. Normal left  ?       Resistive Index. Normal size of left kidney. LRV flow  ?       present. Cyst(s) noted.  ?Mesenteric:  ?Normal Superior Mesenteric artery findings. >50% stenosis in the celiac  ?artery.  ? ?Consultations: ?none ? ?Discharge Exam: ?Vitals:  ? 12/05/21 1225 12/05/21 1405  ?BP: 117/70 112/72  ?Pulse: 61 64  ?Resp: 20   ?Temp: 98.4 ?F (36.9 ?C)   ?SpO2: 93%   ? ?NAD ?Eager for possible discharge ?Wife at bedside ? ?General: No acute distress. ?Cardiovascular: RRR ?Lungs: unlabored ?Abdomen: Soft, nontender, nondistended  ?Neurological: Alert and oriented ?3. Moves all  extremities ?4. Cranial nerves II through XII grossly intact. ?Skin: Warm and dry. No rashes or lesions. ?Extremities: No clubbing or cyanosis. No edema. ? ?Discharge Instructions ? ? ?Discharge Instructions   ? ? Ambulatory referral to Physical Therapy   Complete by: As directed ?  ? Vestibular rehab  ? Call MD for:  difficulty breathing, headache or visual disturbances   Complete by: As directed ?  ? Call MD for:  extreme fatigue   Complete by: As directed ?  ? Call MD for:  hives   Complete by: As directed ?  ? Call MD for:  persistant dizziness or light-headedness   Complete by: As directed ?  ? Call MD for:  persistant nausea and vomiting   Complete by: As directed ?  ? Call MD for:  redness, tenderness, or signs of infection (pain, swelling, redness, odor or green/yellow discharge around incision site)   Complete by: As directed ?  ? Call MD for:  severe uncontrolled pain   Complete by: As directed ?  ? Call MD for:  temperature >100.4   Complete by: As directed ?  ? Diet - low sodium heart healthy   Complete by: As directed ?  ? Discharge instructions   Complete by: As directed ?  ?  You were seen for vertigo and severely high blood pressures. ? ?Your MRI was negative, there were no concerning findings.  I suspect your vertigo is benign paroxysmal positional vertigo.  We'll send you for outpatient vestibular rehab. ? ?Regarding your high blood pressures, it's not immediately clear to me what has caused your sudden increase in blood pressure.  Continue your labetalol as prescribed.  Start amlodipine 5 mg daily.  We'd planned to start more medicines, but you became orthostatic, so I recommend HOLDING ALL ADDITIONAL blood pressure medicines today (labetalol*).   ? ?Check your blood pressure tomorrow morning before taking your blood pressure medicines (labetalol and amlodipine).   ? ?With your lower blood pressures with standing today, we gave you IV fluids.  Things seem improved at this time, but I want you to be  cautious when getting up and changing positions.  The blood pressure medicines you received this morning are going to hang around [place] bit, so if you notice lightheadedness or feeling faint, check your blood pre

## 2021-12-05 NOTE — Progress Notes (Signed)
Renal artery duplex has been completed. ? ? ?Results can be found under chart review under CV PROC. ?12/05/2021 12:16 PM ?Makayleigh Poliquin RVT, RDMS ? ?

## 2021-12-05 NOTE — Progress Notes (Signed)
Physical Therapy Treatment ?Patient Details ?Name: Roy Moody ?MRN: [id number] ?DOB: 27-Oct-1940 ?Today's Date: 12/05/2021 ? ? ?History of Present Illness Patient is 81 y.o. male presented to ED for dizziness, vertigo, and hypertension. PMH significant for HTN, OA, glaucoma, and migraine HA. ? ?  ?PT Comments  ? ? Patient seen for additional PT visit and continues to deny dizziness. Nystagmus still present in Rt gaze only this visit. Vitals assessed and pt orthostatic throughout with BP stabilizing in 90's/60's mmHg when sitting. He reported slight dizziness/lightheaded sensation when standing for >2 minutes. Attempted gait in room to move from one side of the bed to the recliner and pt amb ~10' with min guard/assist. Unable to obtain standing BP as pt developed blank stare and stopped answering questions. Once sitting pt answered questions appropriately again. Will continue to progress pt as able. Recommend he remain in chair if he can tolerate upright position to decrease time in bed. ? ? ? ?Orthostatic VS for the past 24 hrs: ? BP- Lying Pulse- Lying BP- Sitting Pulse- Sitting BP- Standing at 0 minutes Pulse- Standing at 0 minutes BP- Standing at 3 minutes Pulse- Standing at 3 minutes  ?12/05/21 0841 (Rt UE) (!) 161/97 60        ?12/05/21 1132 (Rt UE)   101/69 98 (!) 69/48 75 (!) 66/48 68  ?12/05/21 1142 (Lt UE)   98/67 70 pt had blank stare unable to aanswer question and sat down in recliner     ?12/05/21 1149 (Lt UE)   98/69 68      ?12/05/21 1151 (Lt UE)   98/69 68      ? ?Attempted to take standing vitals after pt ambulated from bed to chair but pt developed blank stair and unable to remain standing for vitals. Pt seated and BP restarted to take next set. BP stable in sitting. ? ? ?  ?Recommendations for follow up therapy are one component of a multi-disciplinary discharge planning process, led by the attending physician.  Recommendations may be updated based on patient status, additional functional  criteria and insurance authorization. ? ?Follow Up Recommendations ? Outpatient PT ?  ?  ?Assistance Recommended at Discharge Intermittent Supervision/Assistance  ?Patient can return home with the following A little help with walking and/or transfers;A little help with bathing/dressing/bathroom;Assistance with cooking/housework;Direct supervision/assist for medications management;Assist for transportation;Help with stairs or ramp for entrance ?  ?Equipment Recommendations ? None recommended by PT (TBA)  ?  ?Recommendations for Other Services   ? ? ?  ?Precautions / Restrictions Precautions ?Precautions: Fall ?Restrictions ?Weight Bearing Restrictions: No  ?  ? ?Mobility ? Bed Mobility ?Overal bed mobility: Needs Assistance ?Bed Mobility: Supine to Sit ?  ?  ?Supine to sit: Supervision, HOB elevated ?Sit to supine: Supervision ?  ?General bed mobility comments: supervision, no assist or cues needed ?  ? ?Transfers ?Overall transfer level: Needs assistance ?Equipment used: None ?Transfers: Sit to/from Stand ?Sit to Stand: Min guard ?  ?  ?  ?  ?  ?General transfer comment: guarding for safety, pt c/o slight lightheadedness after standign for ~2 minutes (pt orthostatic) ?  ? ?Ambulation/Gait ?Ambulation/Gait assistance: Min guard, Min assist ?Gait Distance (Feet): 10 Feet ?Assistive device: None, 1 person hand held assist ?Gait Pattern/deviations: WFL(Within Functional Limits) ?Gait velocity: decr ?  ?  ?General Gait Details: HHA intermittently to steady with gait. no overt LOB. pt reaching for bed to stabilize self. ? ? ?Stairs ?  ?  ?  ?  ?  ? ? ?  Wheelchair Mobility ?  ? ?Modified Rankin (Stroke Patients Only) ?  ? ? ?  ?Balance Overall balance assessment: Needs assistance ?Sitting-balance support: Feet supported ?Sitting balance-Leahy Scale: Good ?  ?  ?Standing balance support: During functional activity, No upper extremity supported, Single extremity supported ?Standing balance-Leahy Scale: Fair ?  ?  ?  ?  ?  ?   ?  ?  ?  ?  ?  ?  ?  ? ?  ?Cognition Arousal/Alertness: Awake/alert ?Behavior During Therapy: [facility] for tasks assessed/performed ?Overall Cognitive Status: Within Functional Limits for tasks assessed ?  ?  ?  ?  ?  ?  ?  ?  ?  ?  ?  ?  ?  ?  ?  ?  ?  ?  ?  ? ?  ?Exercises   ? ?  ?General Comments   ?  ?  ? ?Pertinent Vitals/Pain Pain Assessment ?Pain Assessment: No/denies pain  ? ? ?Home Living   ?  ?  ?  ?  ?  ?  ?  ?  ?  ?   ?  ?Prior Function    ?  ?  ?   ? ?PT Goals (current goals can now be found in the care plan section) Acute Rehab PT Goals ?Patient Stated Goal: get home ?PT Goal Formulation: With patient ?Time For Goal Achievement: 12/11/21 ?Potential to Achieve Goals: Good ?Progress towards PT goals: Progressing toward goals ? ?  ?Frequency ? ? ? Min 3X/week ? ? ? ?  ?PT Plan Current plan remains appropriate  ? ? ?Co-evaluation   ?  ?  ?  ?  ? ?  ?AM-PAC PT "6 Clicks" Mobility   ?Outcome Measure ? Help needed turning from your back to your side while in a flat bed without using bedrails?: None ?Help needed moving from lying on your back to sitting on the side of a flat bed without using bedrails?: None ?Help needed moving to and from a bed to a chair (including a wheelchair)?: A Little ?Help needed standing up from a chair using your arms (e.g., wheelchair or bedside chair)?: A Little ?Help needed to walk in hospital room?: A Little ?Help needed climbing 3-5 steps with a railing? : A Lot ?6 Click Score: 19 ? ?  ?End of Session Equipment Utilized During Treatment: Gait belt ?Activity Tolerance: Patient tolerated treatment well ?Patient left: in bed;with call bell/phone within reach;with family/visitor present ?Nurse Communication: Mobility status ?PT Visit Diagnosis: Unsteadiness on feet (R26.81);Muscle weakness (generalized) (M62.81);Difficulty in walking, not elsewhere classified (R26.2);Dizziness and giddiness (R42) ?  ? ? ?Time: 3532-9924 ?PT Time Calculation (min) (ACUTE ONLY): 30 min ? ?Charges:   $Therapeutic Activity: 23-37 mins          ?          ? ?Gwynneth Albright PT, DPT ?Acute Rehabilitation Services ?Office [phone] ?Pager [phone]  ? ? ?Jacques Navy ?12/05/2021, 3:00 PM ? ?

## 2021-12-08 DIAGNOSIS — I10 Essential (primary) hypertension: Secondary | ICD-10-CM | POA: Diagnosis not present

## 2021-12-08 DIAGNOSIS — I160 Hypertensive urgency: Secondary | ICD-10-CM | POA: Diagnosis not present

## 2021-12-08 DIAGNOSIS — R739 Hyperglycemia, unspecified: Secondary | ICD-10-CM | POA: Diagnosis not present

## 2021-12-13 LAB — ALDOSTERONE + RENIN ACTIVITY W/ RATIO
ALDO / PRA Ratio: UNDETERMINED
Aldosterone: <1 ng/dL (ref 0.0–30.0)
PRA LC/MS/MS: <0.167 ng/mL/hr — ABNORMAL LOW (ref 0.167–5.380)

## 2021-12-14 ENCOUNTER — Ambulatory Visit: Payer: Medicare PPO | Attending: Family Medicine | Admitting: Physical Therapy

## 2021-12-14 ENCOUNTER — Encounter: Payer: Self-pay | Admitting: Physical Therapy

## 2021-12-14 DIAGNOSIS — R42 Dizziness and giddiness: Secondary | ICD-10-CM | POA: Insufficient documentation

## 2021-12-14 NOTE — Therapy (Signed)
[facility] ?[address], [address] ?[place], [place], [address] ?Phone: [phone]   Fax:  [phone] ? ?Physical Therapy Evaluation ? ?Patient Details  ?Name: Roy Moody ?MRN: [id number] ?Date of Birth: 07-10-1941 ?Referring Provider (PT): Elodia Florence., MD (Shon Baton, MD (PCP)) ? ? ?Encounter Date: 12/14/2021 ? ? PT End of Session - 12/14/21 6812   ? ? Visit Number 1   ? Number of Visits 1   ? Date for PT Re-Evaluation 12/14/21   ? Authorization Type [employer]   ? PT Start Time [phone]   ? PT Stop Time [phone]   ? PT Time Calculation (min) 41 min   ? Activity Tolerance Patient tolerated treatment well   ? Behavior During Therapy [facility] for tasks assessed/performed   ? ?  ?  ? ?  ? ? ?Past Medical History:  ?Diagnosis Date  ? Allergy   ? contact allergens  ? Arthritis   ? Cancer [facility])   ? skin cancer  ? Cataract   ? removed   ? Glaucoma   ? History of migraine headaches   ? Hypertension   ? ? ?Past Surgical History:  ?Procedure Laterality Date  ? COLONOSCOPY    ? PANCREAS SURGERY  2008  ? POLYPECTOMY    ? skin cancer removal    ? several  ? UPPER GASTROINTESTINAL ENDOSCOPY    ? VEIN LIGATION Right 2000  ? leg  ? ? ?There were no vitals filed for this visit. ? ? ? Subjective Assessment - 12/14/21 0759   ? ? Subjective Patient reports that he had a feeling of the room spinning and high BP on 12/03/21 that lead to an ED visit and hospitalization. Reports that they got his BP too low and had to stay in the hospital to get it stabilized. Has not had dizziness since. Reports that his Internist did not believe that he needed PT, but he agreed to come in just in case. Initial episode occurred when getting out of bed and lasted <1 minute. Denies head trauma, infection/illness, vision changes/double vision, hearing loss, tinnitus, otalgia. Reports some neck stiffness for the past several weeks. Reports migraines less frequent than before-  about 10-12/year.   ? Pertinent  History HTN, glaucoma, and migraine HA   ? Limitations Lifting;Standing;Walking;House hold activities   ? Diagnostic tests 12/03/21 brain MRI: No acute intracranial abnormality. Findings of chronic small vessel ischemia.; 12/03/21 head CT: No acute intracranial abnormality.   ? Patient Stated Goals assess for dizziness   ? Currently in Pain? No/denies   ? ?  ?  ? ?  ? ? ? ? ? [facility] PT Assessment - 12/14/21 0805   ? ?  ? Assessment  ? Medical Diagnosis Vertigo   ? Referring Provider (PT) Elodia Florence., MD   Shon Baton, MD (PCP)  ? Onset Date/Surgical Date 12/03/21   ? Next MD Visit sometime in May   ? Prior Therapy in acute PT   ?  ? Precautions  ? Precautions None   ?  ? Balance Screen  ? Has the patient fallen in the past 6 months No   ? Has the patient had a decrease in activity level because of a fear of falling?  No   ? Is the patient reluctant to leave their home because of a fear of falling?  No   ?  ? Home Environment  ? Additional Comments Lives in independent living  at [place] with wife   ?  ? Prior Function  ? Level of Independence Independent   ? Vocation Retired   ? Leisure walking   ?  ? Cognition  ? Overall Cognitive Status Within Functional Limits for tasks assessed   ?  ? Sensation  ? Light Touch Appears Intact   ?  ? Ambulation/Gait  ? Assistive device None   ? Gait Pattern Within Functional Limits   ? Ambulation Surface Level;Indoor   ? Gait velocity WNL   ?  ? High Level Balance  ? High Level Balance Comments M-CTSIB: conditions 1-3 demonstrated mild sway, condition 4 monderate sway   ? ?  ?  ? ?  ? ? ? ? ? ? ? ? ? Vestibular Assessment - 12/14/21 0807   ? ?  ? Vestibular Assessment  ? General Observation R eye red and with eyelid drop- reports that this is chronic; kyphotic posture with head resitng in R SBing   ?  ? Oculomotor Exam  ? Oculomotor Alignment Normal   ? Ocular ROM WNL   ? Spontaneous Absent   ? Gaze-induced  Right beating nystagmus with R gaze   ? Smooth Pursuits Intact    ? Saccades Intact   ?  ? Oculomotor Exam-Fixation Suppressed   ? Left Head Impulse positive   ? Right Head Impulse slightly positive   ?  ? Vestibulo-Ocular Reflex  ? VOR 1 Head Only (x 1 viewing) slow horizontal and vertical   c/o mild neck ache  ? VOR Cancellation Normal   ?  ? Positional Testing  ? Dix-Hallpike Dix-Hallpike Right;Dix-Hallpike Left   ? Horizontal Canal Testing Horizontal Canal Right;Horizontal Canal Left   ?  ? Dix-Hallpike Right  ? Dix-Hallpike Right Duration 0   ? Dix-Hallpike Right Symptoms No nystagmus   c/o mild wooziness upon sitting up  ?  ? Dix-Hallpike Left  ? Dix-Hallpike Left Duration 0   ? Dix-Hallpike Left Symptoms No nystagmus   mild dizziness upon sitting up  ?  ? Horizontal Canal Right  ? Horizontal Canal Right Duration 0   ? Horizontal Canal Right Symptoms Normal   ?  ? Horizontal Canal Left  ? Horizontal Canal Left Duration 0   ? Horizontal Canal Left Symptoms Normal   ? ?  ?  ? ?  ? ? ? ? ? ?Objective measurements completed on examination: See above findings.  ? ? ? ? ? ? ? ? ? ? ? ? ? ? PT Education - 12/14/21 0850   ? ? Education Details prognosis, HEP- Access Code: [email or web address]; edu on exam findings, vestibular hypofunction, and answered patient's questions about BPPV   ? Person(s) Educated Patient   ? Methods Explanation;Demonstration;Tactile cues;Verbal cues;Handout   ? Comprehension Verbalized understanding;Returned demonstration   ? ?  ?  ? ?  ? ? ? PT Short Term Goals - 12/14/21 0856   ? ?  ? PT SHORT TERM GOAL #1  ? Title Patient to be independent with initial HEP.   ? Status Achieved   ? Target Date 12/14/21   ? ?  ?  ? ?  ? ? ? ? PT Long Term Goals - 12/14/21 0857   ? ?  ? PT LONG TERM GOAL #1  ? Title Patient to be independent with initial HEP.   ? Status Achieved   ? Target Date 12/14/21   ? ?  ?  ? ?  ? ? ? ? ? ? ? ? ?  Plan - 12/14/21 0851   ? ? Clinical Impression Statement Patient is an 81 y/o M presenting to OPPT with c/o onset of dizziness on 12/03/21. Patient  admitted to hospital for dizziness and HTN and D/C?d on 12/05/21. Reports initial onset of dizziness felt like ?spinning,? lasted <1 minute, and occurred when getting out of bed. Patient reports no instances of dizziness since. Denies head trauma, infection/illness, vision changes/double vision, hearing loss, tinnitus, otalgia. Oculomotor exam revealed R beating nystagmus with R/L gaze, positive L>R HIT, and decreased speed with VOR. Positional testing was negative except for c/o mild wooziness upon sitting up from [facility]. Balance testing showed moderate imbalance with EC/on foam. Ultimately unable to reproduce patient's dizziness today. Patient is active and continues to walk daily, thus believe he will be able to address remaining deficits with HEP.. Patient was thoroughly educated on vestibular hypofunction, HEP, and all questions were answered. Patient only requires 1 time visit at this time.   ? Personal Factors and Comorbidities Age;Comorbidity 3+   ? Comorbidities HTN, glaucoma, and migraine HA   ? Examination-Activity Limitations Transfers;Bed Mobility   ? Stability/Clinical Decision Making Evolving/Moderate complexity   ? Clinical Decision Making Moderate   ? Rehab Potential Good   ? PT Frequency One time visit   ? PT Next Visit Plan 1 time visit at this time   ? Consulted and Agree with Plan of Care Patient   ? ?  ?  ? ?  ? ? ?Patient will benefit from skilled therapeutic intervention in order to improve the following deficits and impairments:  Decreased balance ? ?Visit Diagnosis: ?Dizziness and giddiness ? ? ? ? ?Problem List ?Patient Active Problem List  ? Diagnosis Date Noted  ? Orthostatic hypotension 12/05/2021  ? Intractable headache 12/04/2021  ? Hypertensive urgency 12/03/2021  ? Vertigo 12/03/2021  ? Sinus bradycardia 12/03/2021  ? Overactive bladder 10/27/2021  ? Pain in limb 10/27/2021  ? Encounter for screening for other disorder 01/08/2019  ? Lipoma 01/08/2019  ? Polyp of colon 07/17/2018  ?  Basal cell carcinoma of skin 07/11/2017  ? Varicose veins of lower extremity 07/11/2017  ? Inguinal hernia 12/24/2016  ? Hematuria 07/03/2016  ? Glaucoma 12/23/2014  ? Proteinuria 12/23/2014  ? Allergic rhi

## 2022-01-10 DIAGNOSIS — I10 Essential (primary) hypertension: Secondary | ICD-10-CM | POA: Diagnosis not present

## 2022-01-10 DIAGNOSIS — R7989 Other specified abnormal findings of blood chemistry: Secondary | ICD-10-CM | POA: Diagnosis not present

## 2022-01-10 DIAGNOSIS — Z125 Encounter for screening for malignant neoplasm of prostate: Secondary | ICD-10-CM | POA: Diagnosis not present

## 2022-01-18 DIAGNOSIS — Z1389 Encounter for screening for other disorder: Secondary | ICD-10-CM | POA: Diagnosis not present

## 2022-01-18 DIAGNOSIS — K635 Polyp of colon: Secondary | ICD-10-CM | POA: Diagnosis not present

## 2022-01-18 DIAGNOSIS — Z8679 Personal history of other diseases of the circulatory system: Secondary | ICD-10-CM | POA: Diagnosis not present

## 2022-01-18 DIAGNOSIS — M1990 Unspecified osteoarthritis, unspecified site: Secondary | ICD-10-CM | POA: Diagnosis not present

## 2022-01-18 DIAGNOSIS — Z0000 Encounter for general adult medical examination without abnormal findings: Secondary | ICD-10-CM | POA: Diagnosis not present

## 2022-01-18 DIAGNOSIS — Z1331 Encounter for screening for depression: Secondary | ICD-10-CM | POA: Diagnosis not present

## 2022-01-18 DIAGNOSIS — R739 Hyperglycemia, unspecified: Secondary | ICD-10-CM | POA: Diagnosis not present

## 2022-01-18 DIAGNOSIS — R82998 Other abnormal findings in urine: Secondary | ICD-10-CM | POA: Diagnosis not present

## 2022-01-18 DIAGNOSIS — I10 Essential (primary) hypertension: Secondary | ICD-10-CM | POA: Diagnosis not present

## 2022-01-18 DIAGNOSIS — G43909 Migraine, unspecified, not intractable, without status migrainosus: Secondary | ICD-10-CM | POA: Diagnosis not present

## 2022-04-11 DIAGNOSIS — H401113 Primary open-angle glaucoma, right eye, severe stage: Secondary | ICD-10-CM | POA: Diagnosis not present

## 2022-04-12 DIAGNOSIS — Z85828 Personal history of other malignant neoplasm of skin: Secondary | ICD-10-CM | POA: Diagnosis not present

## 2022-04-12 DIAGNOSIS — D225 Melanocytic nevi of trunk: Secondary | ICD-10-CM | POA: Diagnosis not present

## 2022-04-12 DIAGNOSIS — L570 Actinic keratosis: Secondary | ICD-10-CM | POA: Diagnosis not present

## 2022-04-12 DIAGNOSIS — L821 Other seborrheic keratosis: Secondary | ICD-10-CM | POA: Diagnosis not present

## 2022-04-12 DIAGNOSIS — L918 Other hypertrophic disorders of the skin: Secondary | ICD-10-CM | POA: Diagnosis not present

## 2022-05-16 ENCOUNTER — Ambulatory Visit: Payer: Medicare PPO | Admitting: Orthopaedic Surgery

## 2022-05-16 ENCOUNTER — Ambulatory Visit (INDEPENDENT_AMBULATORY_CARE_PROVIDER_SITE_OTHER): Payer: Medicare PPO

## 2022-05-16 ENCOUNTER — Encounter: Payer: Self-pay | Admitting: Orthopaedic Surgery

## 2022-05-16 DIAGNOSIS — M542 Cervicalgia: Secondary | ICD-10-CM

## 2022-05-16 DIAGNOSIS — M47812 Spondylosis without myelopathy or radiculopathy, cervical region: Secondary | ICD-10-CM | POA: Insufficient documentation

## 2022-05-16 NOTE — Progress Notes (Signed)
Office Visit Note   Patient: Roy Moody           Date of Birth: [birth year]           MRN: [id number] Visit Date: 05/16/2022              Requested by: Shon Baton, [place] [place],  [place] [address] PCP: Shon Baton, MD   Assessment & Plan: Visit Diagnoses:  1. Neck pain   2. Arthritis of neck     Plan: Pleasant 81 year old gentleman with a 64-monthhistory of cervical neck pain.  Denies any trauma.  He denies any radicular findings any paresthesias or weakness in his arms.  He first noticed it 1 day when he got out of bed thought he had just slept wrong.  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  On examination he has no radicular findings.  He does have loss of motion especially with neck extension and turning to the left.  He is also a little less turning to the right.  He is able to touch his head to his chin.  Discussed with him the natural history of this.  Certainly he would benefit from some physical therapy just to go a couple times and learn some exercises he continues to do on his own to keep the mobility is much as possible in his neck.  He will contact [place] he has any radicular findings in his upper extremities.  Otherwise may follow-up as needed May use over-the-counter anti-inflammatories if he can tolerate them.  Otherwise Tylenol.  Follow-Up Instructions: Return if symptoms worsen or fail to improve.   Orders:  Orders Placed This Encounter  Procedures   XR Cervical Spine 2 or 3 views   Ambulatory referral to Physical Therapy   No orders of the defined types were placed in this encounter.     Procedures: No procedures performed   Clinical Data: No additional findings.   Subjective: Chief Complaint  Patient presents with   Neck - Pain  Patient presents today for neck pain. He said that he woke one morning about 556monthago with the pain. No known injury.  He has pain on both sides of his neck. He does feel that it is worse with rotating his head. He does not have any pain, numbness, or tingling into his arms. He is not taking anything for pain.     Review of Systems  All other systems reviewed and are negative.    Objective: Vital Signs: There were no vitals taken for this visit.  Physical Exam Constitutional:      Appearance: Normal appearance.  Pulmonary:     Effort: Pulmonary effort is normal.  Skin:    General: Skin is warm and dry.  Neurological:     Mental Status: He is alert.     Ortho Exam Examination of his neck.  He is able to touch his chin to his chest.  With extension he is very stiff and has to arch his upper back and move his eyes upward in order to see the ceiling.  He has stiffness turning to the left more than turning to the right.  Sensation in his upper extremities is normal.  He has no weakness or strength deficits. Specialty Comments:  No specialty comments available.  Imaging: XR Cervical Spine 2 or 3 views  Result Date: 05/16/2022 2  views of the cervical spine are reviewed today.  No evidence of any lesions or abnormalities in his lung fields.  He has no evidence of a listhesis of the cervical spine.  He does have degenerative changes with calcification of the anterior ligament at C5-6 and C6-7.  Sclerotic changes and loss of joint space at these 2 levels as well especially C6-C7.  No acute fractures or other osseous abnormalities are noted    PMFS History: Patient Active Problem List   Diagnosis Date Noted   Arthritis of neck 05/16/2022   Orthostatic hypotension 12/05/2021   Intractable headache 12/04/2021   Hypertensive urgency 12/03/2021   Vertigo 12/03/2021   Sinus bradycardia 12/03/2021   Overactive bladder 10/27/2021   Pain in limb 10/27/2021   Encounter for screening for other disorder 01/08/2019   Lipoma 01/08/2019   Polyp of colon 07/17/2018   Basal cell carcinoma of skin 07/11/2017    Varicose veins of lower extremity 07/11/2017   Inguinal hernia 12/24/2016   Hematuria 07/03/2016   Glaucoma 12/23/2014   Proteinuria 12/23/2014   Allergic rhinitis 12/19/2012   Underimmunization status 06/24/2012   Macular degeneration 11/30/2010   Disorder of pancreas 11/28/2009   Hyperglycemia 11/28/2009   Benign prostatic hyperplasia with lower urinary tract symptoms 10/25/2009   Dermatitis 10/25/2009   Essential hypertension 10/25/2009   Migraine 10/25/2009   Osteoarthritis 10/25/2009   Migraine, unspecified, not intractable, without status migrainosus 10/25/2009   Past Medical History:  Diagnosis Date   Allergy    contact allergens   Arthritis    Cancer ([place])    skin cancer   Cataract    removed    Glaucoma    History of migraine headaches    Hypertension     Family History  Problem Relation Age of Onset   Colon cancer Father 89   Colon polyps Neg Hx    Esophageal cancer Neg Hx    Rectal cancer Neg Hx    Stomach cancer Neg Hx     Past Surgical History:  Procedure Laterality Date   COLONOSCOPY     PANCREAS SURGERY  2008   POLYPECTOMY     skin cancer removal     several   UPPER GASTROINTESTINAL ENDOSCOPY     VEIN LIGATION Right 2000   leg   Social History   Occupational History   Not on file  Tobacco Use   Smoking status: Never   Smokeless tobacco: Never  Vaping Use   Vaping Use: Never used  Substance and Sexual Activity   Alcohol use: Yes    Alcohol/week: 7.0 standard drinks of alcohol    Types: 7 Glasses of wine per week   Drug use: No   Sexual activity: Not Currently

## 2022-06-05 NOTE — Therapy (Addendum)
OUTPATIENT PHYSICAL THERAPY Shoulder/Cervical EVALUATION Discharge   Patient Name: Roy Moody MRN: [id number] DOB:September 22, 1940, 81 y.o., male Today's Date: 06/06/2022   PT End of Session - 06/06/22 1028     Visit Number 2    Number of Visits 12    Date for PT Re-Evaluation 07/20/22    Authorization Type [employer]    PT Start Time 1022    PT Stop Time 1100    PT Time Calculation (min) 38 min    Activity Tolerance Patient tolerated treatment well    Behavior During Therapy WFL for tasks assessed/performed             Past Medical History:  Diagnosis Date   Allergy    contact allergens   Arthritis    Cancer ([place])    skin cancer   Cataract    removed    Glaucoma    History of migraine headaches    Hypertension    Past Surgical History:  Procedure Laterality Date   COLONOSCOPY     PANCREAS SURGERY  2008   POLYPECTOMY     skin cancer removal     several   UPPER GASTROINTESTINAL ENDOSCOPY     VEIN LIGATION Right 2000   leg   Patient Active Problem List   Diagnosis Date Noted   Arthritis of neck 05/16/2022   Orthostatic hypotension 12/05/2021   Intractable headache 12/04/2021   Hypertensive urgency 12/03/2021   Vertigo 12/03/2021   Sinus bradycardia 12/03/2021   Overactive bladder 10/27/2021   Pain in limb 10/27/2021   Encounter for screening for other disorder 01/08/2019   Lipoma 01/08/2019   Polyp of colon 07/17/2018   Basal cell carcinoma of skin 07/11/2017   Varicose veins of lower extremity 07/11/2017   Inguinal hernia 12/24/2016   Hematuria 07/03/2016   Glaucoma 12/23/2014   Proteinuria 12/23/2014   Allergic rhinitis 12/19/2012   Underimmunization status 06/24/2012   Macular degeneration 11/30/2010   Disorder of pancreas 11/28/2009   Hyperglycemia 11/28/2009   Benign prostatic hyperplasia with lower urinary tract symptoms 10/25/2009   Dermatitis 10/25/2009   Essential hypertension 10/25/2009   Migraine 10/25/2009    Osteoarthritis 10/25/2009   Migraine, unspecified, not intractable, without status migrainosus 10/25/2009    PCP: Shon Baton, MD  REFERRING PROVIDER: Garald Balding, MD  REFERRING DIAG: M54.2 (ICD-10-CM) - Neck pain  THERAPY DIAG:  Cervicalgia  Chronic right shoulder pain  Chronic left shoulder pain  Rationale for Evaluation and Treatment Rehabilitation  ONSET DATE: April 2023  SUBJECTIVE:  SUBJECTIVE STATEMENT: Pt arriving today reporting no pain at rest. Pt stating pain at it's worse can be 3-4/10. Pt stating turning his head can be painful at times. Pt stating he is able to lift light household items without pain. Pt stating there is pain and stiffness when getting in/out of bed every morning which is sharp and only last a short time.   PERTINENT HISTORY:  Allergies, glaucoma, HTN, CA, cataracts  PAIN:  NPRS scale: 3-4/10 at worse Pain location: neck Pain description: sharp, achy Aggravating factors: turning Relieving factors: resting  PRECAUTIONS: None  WEIGHT BEARING RESTRICTIONS No  FALLS:  Has patient fallen in last 6 months? 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    CERVICAL ROM:   Active ROM AROM (deg) 06/06/22  Flexion 40  Extension 24  Right lateral flexion 16  Left lateral flexion 10  Right rotation 45  Left rotation 32   (Blank rows = not tested)  UPPER EXTREMITY ROM:  Active ROM Right 06/06/22 Left 06/06/22  Shoulder flexion 154 160  Shoulder extension 40 40  Shoulder abduction 155 155  Shoulder adduction    Shoulder internal rotation 74 70  Shoulder external rotation 70 70  Elbow flexion    Elbow extension    Wrist flexion    Wrist extension    Wrist ulnar deviation    Wrist radial deviation    Wrist pronation    Wrist supination     (Blank rows = not tested)  UPPER EXTREMITY MMT:  MMT Right  Left   Shoulder flexion 5/5 5/5  Shoulder extension 5/5 5/5  Shoulder abduction 5/5 5/5  Shoulder adduction    Shoulder internal rotation 4/5 4/5  Shoulder external rotation 4/5 4/5  Middle trapezius    Lower trapezius    Elbow flexion    Elbow extension    Wrist flexion    Wrist extension    Wrist ulnar deviation    Wrist radial deviation    Wrist pronation    Wrist supination    Grip strength     (Blank rows = not tested)    FUNCTIONAL TESTS:   06/06/22: 5 times sit to stand: 14 seconds no UE support   TODAY'S TREATMENT:  06/06/22  Therex:    HEP instruction/performance c cues for techniques, handout provided.  Trial set performed of each for comprehension and symptom assessment.  See below for exercise list    PATIENT EDUCATION:   Education details: HEP, POC Person educated: Patient Education method: Explanation, Demonstration, Verbal cues, and Handouts Education comprehension: verbalized understanding, returned demonstration, and verbal cues required    HOME EXERCISE PROGRAM: Access Code: [email or web address] URL: [email or web address] Date: 06/06/2022 Prepared by: Kearney Hard  Exercises - Seated Upper Trap Stretch  - 2 x  daily - 7 x weekly - 3-5 reps - 10 seconds hold - Gentle Levator Scapulae  Stretch  - 2 x daily - 7 x weekly - 3-5 reps - 10 seconds hold - Shoulder External Rotation and Scapular Retraction with Resistance  - 2 x daily - 7 x weekly - 2 sets - 10 reps - 3 seconds hold - Standing Row with Anchored Resistance  - 2 x daily - 7 x weekly - 2 sets - 10 reps - 3 seconds hold  ASSESSMENT:  CLINICAL IMPRESSION: Patient is a 81 y.o. who comes to clinic with complaints of cervical pain and shoulder pain with mobility, strength and movement coordination deficits that impair their ability to perform usual daily and recreational functional activities without increase difficulty/symptoms at this time.  Patient to benefit from skilled PT services to address impairments and limitations to improve to previous level of function without restriction secondary to condition.     OBJECTIVE IMPAIRMENTS decreased ROM, decreased strength, improper body mechanics, postural dysfunction, and pain.   ACTIVITY LIMITATIONS sleeping and driving  PARTICIPATION LIMITATIONS: driving, community activity, and yard work  PERSONAL FACTORS 3+ comorbidities: allergies, CA, cataracts, HTN  are also affecting patient's functional outcome.   REHAB POTENTIAL: Good  CLINICAL DECISION MAKING: Stable/uncomplicated  EVALUATION COMPLEXITY: Low   GOALS: Goals reviewed with patient? Yes  Short term PT Goals (target date for Short term goals are 3 weeks 06/30/22) Patient will demonstrate independent use of home exercise program to maintain progress from in clinic treatments. Goal status: New   Long term PT goals (target dates for all long term goals are 6 weeks  07/20/22 )   1. Patient will demonstrate/report pain at worst less than or equal to 2/10 to facilitate minimal limitation in daily activity secondary to pain symptoms. Goal status: New   2. Patient will demonstrate independent use of home exercise program to facilitate  ability to maintain/progress functional gains from skilled physical therapy services. Goal status: New   3. Patient will demonstrate FOTO outcome > or = 64 % to indicate reduced disability due to condition. Goal status: New   4.  Patient will be able to demonstrate correct body mechanics with lifting 10# from floor to over head shelf c bil UE's.    Goal status: New   5.  Pt will improve bilateral cervical rotation to >/= 50 degrees to improve functional mobility and driving safety.    Goal status: New         PLAN: PT FREQUENCY: 1x/week  PT DURATION: 6 weeks  PLANNED INTERVENTIONS:  Therapeutic exercises, Therapeutic activity, Neuro Muscular re-education, Balance training, Gait training, Patient/Family education, Joint mobilization, Stair training, DME instructions, Dry Needling, Electrical stimulation, Cryotherapy, Traction Moist heat, Taping, Ultrasound, Ionotophoresis 43m/ml Dexamethasone, and Manual therapy.  All included unless contraindicated  PLAN FOR NEXT SESSION: review HEP, consider DN to upper traps, STM, cervical ROM, strengthening        JOretha Caprice PT, MPT 06/06/2022, 10:30 AM  Referring diagnosis? M54.2 (ICD-10-CM) - Neck pain Treatment diagnosis? (if different than referring diagnosis) M54.2, M25.511, M25.512 What was this (referring dx) caused by? '[]'  Surgery '[]'  Fall '[x]'  Ongoing issue '[]'  Arthritis '[]'  Other: ____________  Laterality: '[]'  Rt '[]'  Lt '[x]'  Both  Check all possible CPT codes:  *CHOOSE 10 OR LESS*    '[x]'  97110 (Therapeutic Exercise)  '[]'  92507 (SLP Treatment)  '[x]'  97112 (Neuro Re-ed)   '[]'  92526 (Swallowing Treatment)   '[x]'  97116 (Gait Training)   '[]'  [id number](Cognitive Training, 1st 15 minutes) '[x]'  97140 (Manual Therapy)   '[]'  966440(Cognitive Training,  each add'l 15 minutes)  '[]'  97164 (Re-evaluation)                              '[]'  Other, List CPT Code ____________  '[x]'  95396 (Therapeutic Activities)     '[x]'  97535 (Self Care)   '[]'   All codes above (97110 - 97535)  '[]'  97012 (Mechanical Traction)  '[x]'  97014 (E-stim Unattended)  '[]'  97032 (E-stim manual)  '[]'  97033 (Ionto)  '[x]'  72897 (Ultrasound) '[]'  97750 (Physical Performance Training) '[]'  [id number] (Aquatic Therapy) '[]'  97016 (Vasopneumatic Device) '[]'  [id number] (Paraffin) '[]'  97034 (Contrast Bath) '[]'  97597 (Wound Care 1st 20 sq cm) '[]'  97598 (Wound Care each add'l 20 sq cm) '[]'  97760 (Orthotic Fabrication, Fitting, Training Initial) '[]'  [id number] (Prosthetic Management and Training Initial) '[]'  [phone] (Orthotic or Prosthetic Training/ Modification Subsequent)  PHYSICAL THERAPY DISCHARGE SUMMARY  Visits from Start of Care: 1  Current functional level related to goals / functional outcomes: See above   Remaining deficits: See above   Education / Equipment: HEP   Patient agrees to discharge. Patient goals were not met. Patient is being discharged due to not returning since the last visit.

## 2022-06-06 ENCOUNTER — Encounter: Payer: Self-pay | Admitting: Physical Therapy

## 2022-06-06 ENCOUNTER — Ambulatory Visit: Payer: Medicare PPO | Admitting: Physical Therapy

## 2022-06-06 DIAGNOSIS — M542 Cervicalgia: Secondary | ICD-10-CM

## 2022-06-06 DIAGNOSIS — G8929 Other chronic pain: Secondary | ICD-10-CM

## 2022-06-06 DIAGNOSIS — M25512 Pain in left shoulder: Secondary | ICD-10-CM

## 2022-06-06 DIAGNOSIS — M25511 Pain in right shoulder: Secondary | ICD-10-CM

## 2022-06-11 ENCOUNTER — Encounter (HOSPITAL_BASED_OUTPATIENT_CLINIC_OR_DEPARTMENT_OTHER): Payer: Self-pay | Admitting: Emergency Medicine

## 2022-06-11 ENCOUNTER — Encounter (HOSPITAL_COMMUNITY): Payer: Self-pay

## 2022-06-11 ENCOUNTER — Other Ambulatory Visit: Payer: Self-pay

## 2022-06-11 ENCOUNTER — Emergency Department (HOSPITAL_BASED_OUTPATIENT_CLINIC_OR_DEPARTMENT_OTHER): Payer: Medicare PPO

## 2022-06-11 ENCOUNTER — Observation Stay (HOSPITAL_BASED_OUTPATIENT_CLINIC_OR_DEPARTMENT_OTHER)
Admission: EM | Admit: 2022-06-11 | Discharge: 2022-06-12 | Disposition: A | Discharge status: Home / Self Care | Payer: Medicare PPO | Attending: Internal Medicine | Admitting: Internal Medicine

## 2022-06-11 ENCOUNTER — Observation Stay (HOSPITAL_COMMUNITY): Payer: Medicare PPO

## 2022-06-11 DIAGNOSIS — I672 Cerebral atherosclerosis: Secondary | ICD-10-CM | POA: Diagnosis not present

## 2022-06-11 DIAGNOSIS — G459 Transient cerebral ischemic attack, unspecified: Secondary | ICD-10-CM | POA: Diagnosis not present

## 2022-06-11 DIAGNOSIS — I10 Essential (primary) hypertension: Secondary | ICD-10-CM | POA: Diagnosis present

## 2022-06-11 DIAGNOSIS — R7303 Prediabetes: Secondary | ICD-10-CM | POA: Insufficient documentation

## 2022-06-11 DIAGNOSIS — I6782 Cerebral ischemia: Secondary | ICD-10-CM | POA: Diagnosis not present

## 2022-06-11 DIAGNOSIS — R269 Unspecified abnormalities of gait and mobility: Secondary | ICD-10-CM | POA: Diagnosis present

## 2022-06-11 DIAGNOSIS — I639 Cerebral infarction, unspecified: Secondary | ICD-10-CM | POA: Diagnosis not present

## 2022-06-11 DIAGNOSIS — Z85828 Personal history of other malignant neoplasm of skin: Secondary | ICD-10-CM | POA: Diagnosis not present

## 2022-06-11 DIAGNOSIS — R29818 Other symptoms and signs involving the nervous system: Secondary | ICD-10-CM | POA: Diagnosis not present

## 2022-06-11 DIAGNOSIS — Z91040 Latex allergy status: Secondary | ICD-10-CM | POA: Diagnosis not present

## 2022-06-11 DIAGNOSIS — E785 Hyperlipidemia, unspecified: Secondary | ICD-10-CM | POA: Insufficient documentation

## 2022-06-11 DIAGNOSIS — R2681 Unsteadiness on feet: Secondary | ICD-10-CM | POA: Insufficient documentation

## 2022-06-11 DIAGNOSIS — I679 Cerebrovascular disease, unspecified: Secondary | ICD-10-CM | POA: Diagnosis present

## 2022-06-11 DIAGNOSIS — Z79899 Other long term (current) drug therapy: Secondary | ICD-10-CM | POA: Insufficient documentation

## 2022-06-11 LAB — DIFFERENTIAL
Abs Immature Granulocytes: 0.01 10*3/uL (ref 0.00–0.07)
Basophils Absolute: 0.1 10*3/uL (ref 0.0–0.1)
Basophils Relative: 1 %
Eosinophils Absolute: 0 10*3/uL (ref 0.0–0.5)
Eosinophils Relative: 1 %
Immature Granulocytes: 0 %
Lymphocytes Relative: 31 %
Lymphs Abs: 2.4 10*3/uL (ref 0.7–4.0)
Monocytes Absolute: 0.5 10*3/uL (ref 0.1–1.0)
Monocytes Relative: 7 %
Neutro Abs: 4.7 10*3/uL (ref 1.7–7.7)
Neutrophils Relative %: 60 %

## 2022-06-11 LAB — PROTIME-INR
INR: 1.1 (ref 0.8–1.2)
Prothrombin Time: 13.8 seconds (ref 11.4–15.2)

## 2022-06-11 LAB — CBC
HCT: 42.6 % (ref 39.0–52.0)
Hemoglobin: 14.1 g/dL (ref 13.0–17.0)
MCH: 29.2 pg (ref 26.0–34.0)
MCHC: 33.1 g/dL (ref 30.0–36.0)
MCV: 88.2 fL (ref 80.0–100.0)
Platelets: 226 10*3/uL (ref 150–400)
RBC: 4.83 MIL/uL (ref 4.22–5.81)
RDW: 13.1 % (ref 11.5–15.5)
WBC: 7.7 10*3/uL (ref 4.0–10.5)
nRBC: 0 % (ref 0.0–0.2)

## 2022-06-11 LAB — COMPREHENSIVE METABOLIC PANEL
ALT: 13 U/L (ref 0–44)
AST: 18 U/L (ref 15–41)
Albumin: 4.3 g/dL (ref 3.5–5.0)
Alkaline Phosphatase: 70 U/L (ref 38–126)
Anion gap: 9 (ref 5–15)
BUN: 23 mg/dL (ref 8–23)
CO2: 25 mmol/L (ref 22–32)
Calcium: 9.3 mg/dL (ref 8.9–10.3)
Chloride: 106 mmol/L (ref 98–111)
Creatinine, Ser: 0.89 mg/dL (ref 0.61–1.24)
GFR, Estimated: >60 mL/min (ref >60)
Glucose, Bld: 137 mg/dL — ABNORMAL HIGH (ref 70–99)
Potassium: 4.7 mmol/L (ref 3.5–5.1)
Sodium: 140 mmol/L (ref 135–145)
Total Bilirubin: 0.5 mg/dL (ref 0.3–1.2)
Total Protein: 6.8 g/dL (ref 6.5–8.1)

## 2022-06-11 LAB — ETHANOL: Alcohol, Ethyl (B): <10 mg/dL (ref <10)

## 2022-06-11 LAB — CBG MONITORING, ED: Glucose-Capillary: 132 mg/dL — ABNORMAL HIGH (ref 70–99)

## 2022-06-11 LAB — APTT: aPTT: 27 seconds (ref 24–36)

## 2022-06-11 MED ORDER — ACETAMINOPHEN 160 MG/5ML PO SOLN: 650.0000 mg | ORAL | Status: DC | PRN

## 2022-06-11 MED ORDER — ENOXAPARIN SODIUM 40 MG/0.4ML IJ SOSY
40.0000 mg | PREFILLED_SYRINGE | INTRAMUSCULAR | Status: DC
  Administered 2022-06-12: 40 mg via SUBCUTANEOUS
  Filled 2022-06-11: qty 0.4

## 2022-06-11 MED ORDER — ATORVASTATIN CALCIUM 80 MG PO TABS: 80.0000 mg | ORAL_TABLET | Freq: Every day | ORAL | Status: DC

## 2022-06-11 MED ORDER — IOHEXOL 350 MG/ML SOLN
100.0000 mL | Freq: Once | INTRAVENOUS | Status: AC | PRN
  Administered 2022-06-11: 75 mL via INTRAVENOUS

## 2022-06-11 MED ORDER — SODIUM CHLORIDE 0.9% FLUSH
3.0000 mL | Freq: Once | INTRAVENOUS | Status: DC
  Filled 2022-06-11: qty 3

## 2022-06-11 MED ORDER — ASPIRIN 81 MG PO TBEC
81.0000 mg | DELAYED_RELEASE_TABLET | Freq: Every day | ORAL | Status: DC
  Administered 2022-06-12: 81 mg via ORAL
  Filled 2022-06-11: qty 1

## 2022-06-11 MED ORDER — STROKE: EARLY STAGES OF RECOVERY BOOK
Freq: Once | Status: AC
  Filled 2022-06-11: qty 1

## 2022-06-11 MED ORDER — CLOPIDOGREL BISULFATE 75 MG PO TABS
75.0000 mg | ORAL_TABLET | Freq: Every day | ORAL | Status: DC
  Administered 2022-06-12: 75 mg via ORAL
  Filled 2022-06-11: qty 1

## 2022-06-11 MED ORDER — ACETAMINOPHEN 325 MG PO TABS: 650.0000 mg | ORAL_TABLET | ORAL | Status: DC | PRN

## 2022-06-11 MED ORDER — LORAZEPAM 2 MG/ML IJ SOLN
0.5000 mg | Freq: Once | INTRAMUSCULAR | Status: AC
  Administered 2022-06-11: 0.5 mg via INTRAVENOUS
  Filled 2022-06-11: qty 1

## 2022-06-11 MED ORDER — ACETAMINOPHEN 650 MG RE SUPP: 650.0000 mg | RECTAL | Status: DC | PRN

## 2022-06-11 NOTE — Assessment & Plan Note (Signed)
Hold home BP meds and allow permissive HTN given recent TIA, severe CVD, MRI pending.

## 2022-06-11 NOTE — Progress Notes (Signed)
Plan of Care Note for accepted transfer   Patient: Roy Moody MRN: [id number]   [place]: 06/11/2022  Facility requesting transfer: [facility] Requesting Provider: Dr. Sherry Ruffing  Reason for transfer: abnormal gait  Facility course: 81 year old male with history of HTN, glaucoma, migraines, vertigo who presented to [facility] with complaints of abnormal gait that started at 1am.  He got up to use the bathroom and had to hold onto the walls and was leaning to the left.  He woke up again at 4am with the same symptoms. Resolved at 6:30am. These are not similar to his vertiginous symptoms.   Vitals: stable, increasingly becoming HTN Pertinent labs: none CT head: no acute finding.  CTA head/neck: 1. Severe multifocal atherosclerotic stenosis involving the right A1 segment, right SCA, and right PICA. 2. Moderate stenosis of the V4 segments of the bilateral vertebral arteries. 3. 2 mm medially projecting outpouching arising from the supraclinoid ICA on the right, which could represent a small aneurysm or infundibulum.  In ED: tele neurology consulted. Recommended admit to [facility] for a posterior circulation TIA work up.   Plan of care: The patient is accepted for admission to Telemetry unit, at [facility].  Call inpatient neurology for consult when he arrives.   Author: Orma Flaming, MD 06/11/2022  Check [email or web address] for on-call coverage.  Nursing staff, Please call [place] number on Amion as soon as patient's arrival, so appropriate admitting provider can evaluate the pt.

## 2022-06-11 NOTE — ED Notes (Signed)
Notified provider of pt's continued BP elevation. Awaiting orders and will continue to monitor.

## 2022-06-11 NOTE — ED Notes (Signed)
Patient transported to CT 

## 2022-06-11 NOTE — ED Notes (Signed)
Provided pt with food and drink per MD approval. See prior documentation for stroke swallow screen.

## 2022-06-11 NOTE — H&P (Signed)
History and Physical    Patient: Roy Moody ZOX:[id number] DOB: [birth year] DOA: 06/11/2022 DOS: the patient was seen and examined on 06/11/2022 PCP: Shon Baton, MD  Patient coming from: Home  Chief Complaint:  Chief Complaint  Patient presents with   Abnormal Gait   HPI: Roy Moody is a 81 y.o. male with medical history significant of HTN.  Pt presents to ED at [facility] with c/o L sided weakness episode.  LKW 10pm.  Woke up in middle of night to go to bathroom and had L sided weakness.  Has had vertigo in pact but no vertigo last night.  Symptoms persisted when he woke up again at 4:30 am, resolved when he woke up at 6:30 am.  Symptom free at this time and all day but went in to ED.  Dr. Rory Percy called by EDP, felt pt was high risk for a posterior circulation TIA, recommended admission and formal neurology consult when patient arrived at [facility] (see his note from earlier today).  Review of Systems: As mentioned in the history of present illness. All other systems reviewed and are negative. Past Medical History:  Diagnosis Date   Allergy    contact allergens   Arthritis    Cancer ([place])    skin cancer   Cataract    removed    Glaucoma    History of migraine headaches    Hypertension    Past Surgical History:  Procedure Laterality Date   COLONOSCOPY     PANCREAS SURGERY  2008   POLYPECTOMY     skin cancer removal     several   UPPER GASTROINTESTINAL ENDOSCOPY     VEIN LIGATION Right 2000   leg   Social History:  reports that he has never smoked. He has never used smokeless tobacco. He reports current alcohol use of about 7.0 standard drinks of alcohol per week. He reports that he does not use drugs.  Allergies  Allergen Reactions   Brimonidine    Brimonidine Tartrate    Dorzolamide    Latex    Tape     Family History  Problem Relation Age of Onset   Colon cancer Father 52   Colon polyps Neg Hx    Esophageal cancer Neg Hx    Rectal cancer Neg Hx     Stomach cancer Neg Hx     Prior to Admission medications   Medication Sig Start Date End Date Taking? Authorizing Provider  amLODipine (NORVASC) 5 MG tablet Take 1 tablet (5 mg total) by mouth daily. 12/06/21 01/05/22  Elodia Florence., MD  labetalol (NORMODYNE) 200 MG tablet Take 400 mg by mouth as directed. TAKE 2 TABLETS TWICE DAILY AND 1 EXTRA TABLET AT LUNCH AS NEEDED FOR SBP>140. 10/30/21   [provider]  loratadine (CLARITIN) 10 MG tablet Take 10 mg by mouth in the morning. 01/08/19   [provider]  LUMIGAN 0.01 % SOLN Place 1 drop into both eyes every evening. 11/19/21   [provider]  meclizine (ANTIVERT) 25 MG tablet Take 1 tablet (25 mg total) by mouth 3 (three) times daily as needed for dizziness. 12/05/21   Elodia Florence., MD  Netarsudil Dimesylate (RHOPRESSA) 0.02 % SOLN Place 1 drop into the right eye every evening.    [provider]  timolol (TIMOPTIC) 0.5 % ophthalmic solution Place 1 drop into the right eye every morning. 11/19/21   [provider]  triamcinolone cream (KENALOG) 0.1 % 1 application.  daily as needed (ezcema). 07/11/17   [provider]    Physical Exam: Vitals:   06/11/22 1838 06/11/22 2000 06/11/22 2022 06/11/22 2135  BP:  (!) 176/85 (!) 177/85 (!) 158/86  Pulse:  70 72 64  Resp:  '13 19 18  '$ Temp: 98.2 F (36.8 C)   98.2 F (36.8 C)  TempSrc: Oral   Oral  SpO2:  96% 96% 95%  Weight:      Height:       Constitutional: NAD, calm, comfortable Eyes: PERRL, lids and conjunctivae normal ENMT: Mucous membranes are moist. Posterior pharynx clear of any exudate or lesions.Normal dentition.  Neck: normal, supple, no masses, no thyromegaly Respiratory: clear to auscultation bilaterally, no wheezing, no crackles. Normal respiratory effort. No accessory muscle use.  Cardiovascular: Regular rate and rhythm, no murmurs / rubs / gallops. No extremity edema. 2+ pedal pulses. No carotid bruits.   Abdomen: no tenderness, no masses palpated. No hepatosplenomegaly. Bowel sounds positive.  Musculoskeletal: no clubbing / cyanosis. No joint deformity upper and lower extremities. Good ROM, no contractures. Normal muscle tone.  Skin: no rashes, lesions, ulcers. No induration Neurologic: CN 2-12 grossly intact. Sensation intact, DTR normal. Strength 5/5 in all 4.  Psychiatric: Normal judgment and insight. Alert and oriented x 3. Normal mood.   Data Reviewed:    CT head = no acute findings.  CTA head and neck:IMPRESSION: 1. Severe multifocal atherosclerotic stenosis involving the right A1 segment, right SCA, and right PICA. 2. Moderate stenosis of the V4 segments of the bilateral vertebral arteries. 3. 2 mm medially projecting outpouching arising from the supraclinoid ICA on the right, which could represent a small aneurysm or infundibulum. 4. See same day CT brain for additional findings.   Assessment and Plan: * Transient ischemic attack (TIA) ABCD2 score of 6. Very concerned especially following CTA findings that last nights events represent a true TIA with L sided symptoms from R sided severe atherosclerotic dz of brain. Now also suspicious that events of vertigo back in April may have been related to TIA and his CVD as well. Stroke pathway and work up Pt in MRI now Notified neurology of pt arrival: Allowing permissive HTN (either for stroke if MRI shows this, or because I'm concerned that dropping BP may cause stroke with severe CVD and recent TIA if not) Defer to neurology the question of when to resume home BP meds (presumably will depend on what MRI shows as well). ASA 81 daily Plavix 75 for 21 days Check FLP Starting high dose statin PT/OT/SLP  CVD (cerebrovascular disease) CTA findings demonstrate severe stenotic CVD in multiple vessels. 1) Start atorvastatin '80mg'$  2) Lipid profile  Essential hypertension Hold home BP meds and allow permissive HTN given recent  TIA, severe CVD, MRI pending.      Advance Care Planning:   Code Status: Full Code  Consults: Neurology  Family Communication: No family in room  Severity of Illness: The appropriate patient status for this patient is OBSERVATION. Observation status is judged to be reasonable and necessary in order to provide the required intensity of service to ensure the patient's safety. The patient's presenting symptoms, physical exam findings, and initial radiographic and laboratory data in the context of their medical condition is felt to place them at decreased risk for further clinical deterioration. Furthermore, it is anticipated that the patient will be medically stable for discharge from the hospital within 2 midnights of admission.   Author: Etta Quill., DO 06/11/2022 10:51 PM  For on call review [email or web address].

## 2022-06-11 NOTE — Plan of Care (Signed)
ON CALL PHONE CONSULT  Call from Dr. Tegler'@[facility]'$  [facility]   Discussion: 81 year old with last known well at 49 PM last night, woke up middle of the night to go to the bathroom and kept falling to the left.  Has had vertigo in the past but today did not have vertigo but persistent left-sided weakness/ataxia that persisted when he woke up again at 4:30 in the morning and had resolved only after 6:30 AM.  Has been getting some physical therapy for neck pain now for 6 months-currently no neck pain.  CT head negative. I recommended that a CT and review head and neck be done to rule out dissection and he be admitted to [facility] for a posterior circulation TIA work-up given his ABCD2 score is near about 4 and the duration of symptoms was quite long.  Admit to hospitalist Please call inpatient neurology for formal consultation once the patient arrives at [place], MD Neurologist [facility] Pager: [phone]

## 2022-06-11 NOTE — Assessment & Plan Note (Addendum)
CTA findings demonstrate severe stenotic CVD in multiple vessels. 1) Start atorvastatin '80mg'$  2) Lipid profile

## 2022-06-11 NOTE — ED Triage Notes (Signed)
Pt went to bed at 10pm. He woke up at 1am to use the bathroom and states he had to hold on to the walls to make it there and was leaning to the left. He woke up again at 4am with the same symptoms. Symptoms resolved at 6:30am.

## 2022-06-11 NOTE — ED Provider Notes (Signed)
Code Status updated. Patient wishes for chest compression and/or intubation if cardiac arrest, respiratory arrest, or any demise in medical status occurs throughout his stay.   6:19 PM 09/17/2409 Sheldon Silvan, DO 46/43/14 1824

## 2022-06-11 NOTE — ED Provider Notes (Signed)
[facility] EMERGENCY DEPT Provider Note   CSN: [id number] Arrival date & time: 06/11/22  0940     History  Chief Complaint  Patient presents with   abnormal gait    Roy Moody is a 81 y.o. male.  The history is provided by the patient and medical records. No language interpreter was used.  Neurologic Problem This is a new problem. The current episode started 12 to 24 hours ago. The problem has been resolved. Pertinent negatives include no chest pain, no abdominal pain, no headaches and no shortness of breath. The symptoms are aggravated by walking. Nothing relieves the symptoms. He has tried nothing for the symptoms. The treatment provided no relief.       Home Medications Prior to Admission medications   Medication Sig Start Date End Date Taking? Authorizing Provider  amLODipine (NORVASC) 5 MG tablet Take 1 tablet (5 mg total) by mouth daily. 12/06/21 01/05/22  Elodia Florence., MD  labetalol (NORMODYNE) 200 MG tablet Take 400 mg by mouth as directed. TAKE 2 TABLETS TWICE DAILY AND 1 EXTRA TABLET AT LUNCH AS NEEDED FOR SBP>140. 10/30/21   [provider]  loratadine (CLARITIN) 10 MG tablet Take 10 mg by mouth in the morning. 01/08/19   [provider]  LUMIGAN 0.01 % SOLN Place 1 drop into both eyes every evening. 11/19/21   [provider]  meclizine (ANTIVERT) 25 MG tablet Take 1 tablet (25 mg total) by mouth 3 (three) times daily as needed for dizziness. 12/05/21   Elodia Florence., MD  Netarsudil Dimesylate (RHOPRESSA) 0.02 % SOLN Place 1 drop into the right eye every evening.    [provider]  timolol (TIMOPTIC) 0.5 % ophthalmic solution Place 1 drop into the right eye every morning. 11/19/21   [provider]  triamcinolone cream (KENALOG) 0.1 % 1 application. daily as needed (ezcema). 07/11/17   [provider]      Allergies    Brimonidine, Brimonidine tartrate, Dorzolamide, Latex, and  Tape    Review of Systems   Review of Systems  Constitutional:  Negative for chills, diaphoresis, fatigue and fever.  HENT:  Negative for congestion, ear pain and sore throat.   Eyes:  Negative for pain and visual disturbance.  Respiratory:  Negative for cough, chest tightness and shortness of breath.   Cardiovascular:  Negative for chest pain and palpitations.  Gastrointestinal:  Negative for abdominal pain, constipation, diarrhea, nausea and vomiting.  Genitourinary:  Negative for dysuria and hematuria.  Musculoskeletal:  Positive for gait problem (resolved now) and neck pain. Negative for arthralgias, back pain and neck stiffness.  Skin:  Negative for color change, rash and wound.  Neurological:  Negative for seizures, syncope, weakness, light-headedness, numbness and headaches.  Psychiatric/Behavioral:  Negative for agitation.   All other systems reviewed and are negative.   Physical Exam Updated Vital Signs BP 123/88 (BP Location: Right Arm)   Pulse 66   Temp 97.9 F (36.6 C)   Resp 18   Ht 5' 9.5" (1.765 m)   Wt 72.9 kg   SpO2 97%   BMI 23.39 kg/m  Physical Exam Vitals and nursing note reviewed.  Constitutional:      General: He is not in acute distress.    Appearance: He is well-developed. He is not ill-appearing, toxic-appearing or diaphoretic.  HENT:     Head: Normocephalic and atraumatic.     Nose: Nose normal.     Mouth/Throat:  Mouth: Mucous membranes are moist.  Eyes:     Extraocular Movements: Extraocular movements intact.     Conjunctiva/sclera: Conjunctivae normal.     Pupils: Pupils are equal, round, and reactive to light.  Neck:     Vascular: No carotid bruit.  Cardiovascular:     Rate and Rhythm: Normal rate and regular rhythm.     Pulses: Normal pulses.     Heart sounds: No murmur heard. Pulmonary:     Effort: Pulmonary effort is normal. No respiratory distress.     Breath sounds: Normal breath sounds. No wheezing, rhonchi or rales.  Chest:      Chest wall: No tenderness.  Abdominal:     Palpations: Abdomen is soft.     Tenderness: There is no abdominal tenderness. There is no guarding or rebound.  Musculoskeletal:        General: No swelling or tenderness.     Cervical back: Neck supple. No tenderness.     Right lower leg: No edema.     Left lower leg: No edema.  Skin:    General: Skin is warm and dry.     Capillary Refill: Capillary refill takes less than 2 seconds.     Findings: No erythema or rash.  Neurological:     General: No focal deficit present.     Mental Status: He is alert.     Cranial Nerves: No cranial nerve deficit.     Sensory: No sensory deficit.     Motor: No weakness.     Coordination: Coordination normal.     Gait: Gait normal.  Psychiatric:        Mood and Affect: Mood normal.     ED Results / Procedures / Treatments   Labs (all labs ordered are listed, but only abnormal results are displayed) Labs Reviewed  COMPREHENSIVE METABOLIC PANEL - Abnormal; Notable for the following components:      Result Value   Glucose, Bld 137 (*)    All other components within normal limits  CBG MONITORING, ED - Abnormal; Notable for the following components:   Glucose-Capillary 132 (*)    All other components within normal limits  PROTIME-INR  APTT  CBC  DIFFERENTIAL  ETHANOL  CBG MONITORING, ED    EKG None  Radiology CT ANGIO HEAD NECK W WO CM  Result Date: 06/11/2022 CLINICAL DATA:  Stroke like symptoms EXAM: CT ANGIOGRAPHY HEAD AND NECK TECHNIQUE: Multidetector CT imaging of the head and neck was performed using the standard protocol during bolus administration of intravenous contrast. Multiplanar CT image reconstructions and MIPs were obtained to evaluate the vascular anatomy. Carotid stenosis measurements (when applicable) are obtained utilizing NASCET criteria, using the distal internal carotid diameter as the denominator. RADIATION DOSE REDUCTION: This exam was performed according to the  departmental dose-optimization program which includes automated exposure control, adjustment of the mA and/or kV according to patient size and/or use of iterative reconstruction technique. CONTRAST:  67m OMNIPAQUE IOHEXOL 350 MG/ML SOLN COMPARISON:  None Available. FINDINGS: CT HEAD FINDINGS: See same day CT Brain Other: Prominent left level 1B lymph node measuring 7 mm (series 80 5, image 54). Review of the MIP images confirms the above findings CTA NECK FINDINGS Aortic arch: Standard branching. Imaged portion shows no evidence of aneurysm or dissection. No significant stenosis of the major arch vessel origins. Right carotid system: 2 mm medially projecting outpouching arising from the supraclinoid ICA on the right (series 7, image 119). Left carotid system: No evidence of  dissection, stenosis (50% or greater), or occlusion. Vertebral arteries: Moderate stenosis of the V4 segment of the right vertebral artery (series 7, image 153).Moderate stenosis of the V4 segment of the left vertebral artery (series 7, image 146). Skeleton: Negative. Other neck: 6 mm right thyroid nodule (series 5, image 26). Upper chest: Negative. Review of the MIP images confirms the above findings CTA HEAD FINDINGS Anterior circulation: Severe multifocal stenosis of the right A1 segment. The A2 segments bilaterally are contrast opacified without stenosis. Anteriorly projecting outpouching at the paraclinoid ICA on the right measuring 2 mm (series 101, image 124). Posterior circulation: Moderate to severe stenoses in the proximal right SCA.Mild stenosis at the P1/P2 junction on the right. Severe stenosis and likely focal occlusion of the right PICA. Venous sinuses: As permitted by contrast timing, patent. Anatomic variants: None Review of the MIP images confirms the above findings IMPRESSION: 1. Severe multifocal atherosclerotic stenosis involving the right A1 segment, right SCA, and right PICA. 2. Moderate stenosis of the V4 segments of the  bilateral vertebral arteries. 3. 2 mm medially projecting outpouching arising from the supraclinoid ICA on the right, which could represent a small aneurysm or infundibulum. 4. See same day CT brain for additional findings. Electronically Signed   By: Marin Roberts M.D.   On: 06/11/2022 12:58   CT HEAD WO CONTRAST  Result Date: 06/11/2022 CLINICAL DATA:  TIA EXAM: CT HEAD WITHOUT CONTRAST TECHNIQUE: Contiguous axial images were obtained from the base of the skull through the vertex without intravenous contrast. RADIATION DOSE REDUCTION: This exam was performed according to the departmental dose-optimization program which includes automated exposure control, adjustment of the mA and/or kV according to patient size and/or use of iterative reconstruction technique. COMPARISON:  12/03/2021 FINDINGS: Brain: No acute intracranial findings are seen. There are no signs of bleeding within the cranium. Ventricles are not dilated. Cortical sulci are prominent. There is no focal edema or mass effect. Vascular: Scattered arterial calcifications are seen. Ectasia of suprasellar left ICA has not changed. Skull: Unremarkable. Sinuses/Orbits: There is mucosal thickening in right maxillary sinus. Other: None. IMPRESSION: No acute intracranial findings are seen in noncontrast CT brain. Atrophy. Mild chronic sinusitis. Electronically Signed   By: Elmer Picker M.D.   On: 06/11/2022 10:27    Procedures Procedures    Medications Ordered in ED Medications  sodium chloride flush (NS) 0.9 % injection 3 mL (3 mLs Intravenous Not Given 06/11/22 1046)  iohexol (OMNIPAQUE) 350 MG/ML injection 100 mL (75 mLs Intravenous Contrast Given 06/11/22 1218)    ED Course/ Medical Decision Making/ A&P                           Medical Decision Making Amount and/or Complexity of Data Reviewed Labs: ordered. Radiology: ordered.  Risk Prescription drug management. Decision regarding hospitalization.    Roy Moody  is a 81 y.o. male with a past medical history significant for hypertension, migraines, previous skin cancer, glaucoma, neck arthritis currently getting neck physical therapy, and previous vertigo who presents with unsteadiness with gait overnight.  According to patient, he went to bed around 10 PM last night feeling at his baseline and then woke up around 1:30 AM to go the restroom and kept falling to the left.  He had to hold onto the wall and doors to ambulate although he reports he was not feeling room spinning dizziness like he did with previous vertigo.  He denies any speech difficulties, vision  changes, or any other symptoms aside from him not being able to stand up straight and kept falling to the left primarily.  Denies nausea, vomiting, constipation, diarrhea, or urinary changes.  Denies any true falls or head injury.  He reports he chronically has neck pain for the last few months and has been having neck physical therapy with manipulation and stretches frequently.  He reports the pain did not worsen in his neck.   He said that around 4 AM he got up again but then by 6:30 AM the symptoms had nearly resolved.  By 7 AM he says they were totally gone and he feels at his baseline with ambulation now.  He said he is never had these symptoms before and is concerned about possible TIA.   Denies any recent medication changes and only reports the physical therapy being new for the last few months.  On exam, lungs clear and chest nontender.  Gait was normal.  Intact sensation, strength, and pulses in extremities.  Normal finger-nose-finger testing.  Normal heel shin testing.  Clear speech.  Pupils are symmetric and reactive, extract movements.  Symmetric smile.  Normal sensation of the face.  Exam otherwise completely unremarkable with no evidence of acute trauma.  Clinically I am concerned that patient could have had a neurologic cause of his unsteadiness and falling to the left acutely.  As he is back to  baseline, low suspicion for acute stroke and more concern for possible TIA versus vertebral artery trouble given the recent neck for PT and chronic neck soreness.  Patient had a CT head without contrast in triage that did not show any acute abnormality or any acute stroke.  We will discuss with neurology to determine disposition but anticipate they may want CTA head and neck and possibly MRI.  Anticipate reassessment after discussion with neurology.  12:04 PM Joe spoke to Dr. Rory Percy with neurology.  He recommended CTA head and neck and then due to the patient's risk score, he actually feels patient needs admission to the hospital for MRI and further TIA work-up.  1:36 PM CTA does reveal some significant stenoses in the neurovasculature and neck vasculature.  There is also an infundibulum versus very small aneurysm.  I reached out to neurology who said the plan stays the same and he will have the neurology team see him at [facility] for the posterior circulation TIA work-up.        Final Clinical Impression(s) / ED Diagnoses Final diagnoses:  TIA (transient ischemic attack)     Clinical Impression: 1. TIA (transient ischemic attack)     Disposition: Admit  This note was prepared with assistance of Dragon voice recognition software. Occasional wrong-word or sound-a-like substitutions may have occurred due to the inherent limitations of voice recognition software. ]   Caridad Silveira, Gwenyth Allegra, MD 06/11/22 [phone]

## 2022-06-11 NOTE — Assessment & Plan Note (Signed)
ABCD2 score of 6. Very concerned especially following CTA findings that last nights events represent a true TIA with L sided symptoms from R sided severe atherosclerotic dz of brain. Now also suspicious that events of vertigo back in April may have been related to TIA and his CVD as well. 1. Stroke pathway and work up 2. Pt in MRI now 3. Notified neurology of pt arrival: 4. Allowing permissive HTN (either for stroke if MRI shows this, or because I'm concerned that dropping BP may cause stroke with severe CVD and recent TIA if not) 1. Defer to neurology the question of when to resume home BP meds (presumably will depend on what MRI shows as well). 5. ASA 81 daily 6. Plavix 75 for 21 days 7. Check FLP 8. Starting high dose statin 9. PT/OT/SLP

## 2022-06-11 NOTE — ED Notes (Signed)
Notified provider of pt vital signs. Awaiting orders and will continue to monitor.

## 2022-06-12 ENCOUNTER — Observation Stay (HOSPITAL_COMMUNITY): Payer: Medicare PPO

## 2022-06-12 ENCOUNTER — Observation Stay (HOSPITAL_BASED_OUTPATIENT_CLINIC_OR_DEPARTMENT_OTHER): Payer: Medicare PPO

## 2022-06-12 ENCOUNTER — Other Ambulatory Visit: Payer: Self-pay

## 2022-06-12 DIAGNOSIS — I672 Cerebral atherosclerosis: Secondary | ICD-10-CM | POA: Diagnosis not present

## 2022-06-12 DIAGNOSIS — G459 Transient cerebral ischemic attack, unspecified: Secondary | ICD-10-CM

## 2022-06-12 DIAGNOSIS — Z85828 Personal history of other malignant neoplasm of skin: Secondary | ICD-10-CM | POA: Diagnosis not present

## 2022-06-12 DIAGNOSIS — I10 Essential (primary) hypertension: Secondary | ICD-10-CM | POA: Diagnosis not present

## 2022-06-12 DIAGNOSIS — I6782 Cerebral ischemia: Secondary | ICD-10-CM | POA: Diagnosis not present

## 2022-06-12 DIAGNOSIS — R7303 Prediabetes: Secondary | ICD-10-CM | POA: Diagnosis not present

## 2022-06-12 DIAGNOSIS — E785 Hyperlipidemia, unspecified: Secondary | ICD-10-CM | POA: Diagnosis not present

## 2022-06-12 DIAGNOSIS — Z91040 Latex allergy status: Secondary | ICD-10-CM | POA: Diagnosis not present

## 2022-06-12 DIAGNOSIS — I639 Cerebral infarction, unspecified: Secondary | ICD-10-CM | POA: Diagnosis not present

## 2022-06-12 DIAGNOSIS — I679 Cerebrovascular disease, unspecified: Secondary | ICD-10-CM | POA: Diagnosis not present

## 2022-06-12 DIAGNOSIS — Z79899 Other long term (current) drug therapy: Secondary | ICD-10-CM | POA: Diagnosis not present

## 2022-06-12 LAB — ECHOCARDIOGRAM COMPLETE
AR max vel: 2.52 cm2
AV Area VTI: 2.74 cm2
AV Area mean vel: 2.67 cm2
AV Mean grad: 5 mmHg
AV Peak grad: 9.4 mmHg
Ao pk vel: 1.53 m/s
Area-P 1/2: 2.84 cm2
Calc EF: 55.5 %
Height: 69.5 in
MV M vel: 3.77 m/s
MV Peak grad: 56.9 mmHg
S' Lateral: 3.2 cm
Single Plane A2C EF: 51 %
Single Plane A4C EF: 57.7 %
Weight: 2571.45 oz

## 2022-06-12 LAB — LIPID PANEL
Cholesterol: 174 mg/dL (ref 0–200)
HDL: 48 mg/dL (ref >40)
LDL Cholesterol: 113 mg/dL — ABNORMAL HIGH (ref 0–99)
Total CHOL/HDL Ratio: 3.6 RATIO
Triglycerides: 63 mg/dL (ref <150)
VLDL: 13 mg/dL (ref 0–40)

## 2022-06-12 LAB — HEMOGLOBIN A1C
Hgb A1c MFr Bld: 5.8 % — ABNORMAL HIGH (ref 4.8–5.6)
Mean Plasma Glucose: 119.76 mg/dL

## 2022-06-12 LAB — GLUCOSE, CAPILLARY: Glucose-Capillary: 111 mg/dL — ABNORMAL HIGH (ref 70–99)

## 2022-06-12 MED ORDER — SODIUM CHLORIDE 0.9 % IV SOLN: INTRAVENOUS | Status: DC

## 2022-06-12 MED ORDER — INFLUENZA VAC A&B SA ADJ QUAD 0.5 ML IM PRSY
0.5000 mL | PREFILLED_SYRINGE | INTRAMUSCULAR | Status: DC
  Filled 2022-06-12: qty 0.5

## 2022-06-12 MED ORDER — ATORVASTATIN CALCIUM 40 MG PO TABS: 40.0000 mg | ORAL_TABLET | Freq: Every day | ORAL | 0 refills | Status: AC

## 2022-06-12 MED ORDER — ASPIRIN 81 MG PO TBEC: 81.0000 mg | DELAYED_RELEASE_TABLET | Freq: Every day | ORAL | 0 refills | Status: AC

## 2022-06-12 MED ORDER — PERFLUTREN LIPID MICROSPHERE
1.0000 mL | INTRAVENOUS | Status: AC | PRN
  Administered 2022-06-12: 2 mL via INTRAVENOUS

## 2022-06-12 MED ORDER — ATORVASTATIN CALCIUM 40 MG PO TABS
40.0000 mg | ORAL_TABLET | Freq: Every day | ORAL | Status: DC
  Administered 2022-06-12: 40 mg via ORAL
  Filled 2022-06-12: qty 1

## 2022-06-12 MED ORDER — CLOPIDOGREL BISULFATE 75 MG PO TABS: 75.0000 mg | ORAL_TABLET | Freq: Every day | ORAL | 0 refills | Status: AC

## 2022-06-12 NOTE — Progress Notes (Addendum)
STROKE TEAM PROGRESS NOTE   INTERVAL HISTORY Patient reports this morning from 1-6 am, he reports left leg weakness and difficulty standing up to walk to the bathroom. Denies N/V and vertigo during this episode. He notes this past April, he had an episode of vertigo while is was sitting down which lasted for four minutes. On evaluation, he denies symptoms of weakness or dizziness.  Vitals:   06/12/22 0435 06/12/22 0637 06/12/22 0739 06/12/22 1203  BP: (!) 197/84 (!) 172/91 (!) 178/92 (!) 171/74  Pulse: 64  72 65  Resp: '17 16 16 17  '$ Temp: 97.8 F (36.6 C) 98 F (36.7 C) (!) 97.5 F (36.4 C) (!) 97.5 F (36.4 C)  TempSrc: Oral Oral Oral Oral  SpO2: 93% 92% 93% 97%  Weight:      Height:       CBC:  Recent Labs  Lab 06/11/22 1009  WBC 7.7  NEUTROABS 4.7  HGB 14.1  HCT 42.6  MCV 88.2  PLT 119   Basic Metabolic Panel:  Recent Labs  Lab 06/11/22 1009  NA 140  K 4.7  CL 106  CO2 25  GLUCOSE 137*  BUN 23  CREATININE 0.89  CALCIUM 9.3   Lipid Panel:  Recent Labs  Lab 06/12/22 0323  CHOL 174  TRIG 63  HDL 48  CHOLHDL 3.6  VLDL 13  LDLCALC 113*   HgbA1c:  Recent Labs  Lab 06/12/22 0323  HGBA1C 5.8*   Urine Drug Screen: No results for input(s): "LABOPIA", "COCAINSCRNUR", "LABBENZ", "AMPHETMU", "THCU", "LABBARB" in the last 168 hours.  Alcohol Level  Recent Labs  Lab 06/11/22 1009  ETH <10    IMAGING past 24 hours ECHOCARDIOGRAM COMPLETE  Result Date: 06/12/2022    ECHOCARDIOGRAM REPORT   Patient Name:   Roy Moody Date of Exam: 06/12/2022 Medical Rec #:  [id number]          Height:       69.5 in Accession #:    [id number]         Weight:       160.7 lb Date of Birth:  [birth year]          BSA:          1.892 m Patient Age:    81 years           BP:           178/92 mmHg Patient Gender: M                  HR:           65 bpm. Exam Location:  Inpatient Procedure: 2D Echo and Intracardiac Opacification Agent Indications:    Stroke  History:         Patient has prior history of Echocardiogram examinations, most                 recent 12/04/2021. TIA; Risk Factors:Hypertension.  Sonographer:    Harvie Junior Referring Phys: [phone] JARED M GARDNER  Sonographer Comments: Technically difficult study due to poor echo windows. Image acquisition challenging due to respiratory motion. IMPRESSIONS  1. Left ventricular ejection fraction, by estimation, is 55 to 60%. The left ventricle has normal function. The left ventricle has no regional wall motion abnormalities. Left ventricular diastolic parameters are consistent with Grade I diastolic dysfunction (impaired relaxation).  2. Right ventricular systolic function is normal. The right ventricular size is normal. There is normal pulmonary artery systolic pressure. The estimated right  ventricular systolic pressure is 40.9 mmHg.  3. The mitral valve is abnormal. Trivial mitral valve regurgitation.  4. The aortic valve is tricuspid. Aortic valve regurgitation is not visualized. Aortic valve sclerosis/calcification is present, without any evidence of aortic stenosis.  5. The inferior vena cava is normal in size with greater than 50% respiratory variability, suggesting right atrial pressure of 3 mmHg. Comparison(s): No significant change from prior study. 12/04/2021: LVEF 55-60%, grade 1 DD, mild MR. FINDINGS  Left Ventricle: Left ventricular ejection fraction, by estimation, is 55 to 60%. The left ventricle has normal function. The left ventricle has no regional wall motion abnormalities. Definity contrast agent was given IV to delineate the left ventricular  endocardial borders. The left ventricular internal cavity size was normal in size. There is no left ventricular hypertrophy. Left ventricular diastolic parameters are consistent with Grade I diastolic dysfunction (impaired relaxation). Indeterminate filling pressures. Right Ventricle: The right ventricular size is normal. No increase in right ventricular wall thickness. Right  ventricular systolic function is normal. There is normal pulmonary artery systolic pressure. The tricuspid regurgitant velocity is 2.36 m/s, and  with an assumed right atrial pressure of 3 mmHg, the estimated right ventricular systolic pressure is 73.5 mmHg. Left Atrium: Left atrial size was normal in size. Right Atrium: Right atrial size was normal in size. Pericardium: There is no evidence of pericardial effusion. Mitral Valve: The mitral valve is abnormal. Mild mitral annular calcification. Trivial mitral valve regurgitation. Tricuspid Valve: The tricuspid valve is grossly normal. Tricuspid valve regurgitation is trivial. Aortic Valve: The aortic valve is tricuspid. Aortic valve regurgitation is not visualized. Aortic valve sclerosis/calcification is present, without any evidence of aortic stenosis. Aortic valve mean gradient measures 5.0 mmHg. Aortic valve peak gradient measures 9.4 mmHg. Aortic valve area, by VTI measures 2.74 cm. Pulmonic Valve: The pulmonic valve was normal in structure. Pulmonic valve regurgitation is not visualized. Aorta: The aortic root and ascending aorta are structurally normal, with no evidence of dilitation. Venous: The inferior vena cava is normal in size with greater than 50% respiratory variability, suggesting right atrial pressure of 3 mmHg. IAS/Shunts: No atrial level shunt detected by color flow Doppler.  LEFT VENTRICLE PLAX 2D LVIDd:         4.40 cm     Diastology LVIDs:         3.20 cm     LV e' medial:    8.70 cm/s LV PW:         1.00 cm     LV E/e' medial:  8.5 LV IVS:        1.00 cm     LV e' lateral:   8.05 cm/s LVOT diam:     2.10 cm     LV E/e' lateral: 9.2 LV SV:         94 LV SV Index:   49 LVOT Area:     3.46 cm  LV Volumes (MOD) LV vol d, MOD A2C: 68.4 ml LV vol d, MOD A4C: 86.9 ml LV vol s, MOD A2C: 33.5 ml LV vol s, MOD A4C: 36.8 ml LV SV MOD A2C:     34.9 ml LV SV MOD A4C:     86.9 ml LV SV MOD BP:      45.8 ml RIGHT VENTRICLE RV Basal diam:  3.30 cm RV Mid  diam:    2.50 cm RV S prime:     14.10 cm/s TAPSE (M-mode): 3.0 cm LEFT ATRIUM  Index        RIGHT ATRIUM           Index LA diam:        3.20 cm 1.69 cm/m   RA Area:     16.20 cm LA Vol (A2C):   43.5 ml 22.99 ml/m  RA Volume:   41.30 ml  21.83 ml/m LA Vol (A4C):   41.7 ml 22.04 ml/m LA Biplane Vol: 44.2 ml 23.36 ml/m  AORTIC VALVE                     PULMONIC VALVE AV Area (Vmax):    2.52 cm      PV Vmax:       0.93 m/s AV Area (Vmean):   2.67 cm      PV Peak grad:  3.5 mmHg AV Area (VTI):     2.74 cm AV Vmax:           153.00 cm/s AV Vmean:          102.000 cm/s AV VTI:            0.341 m AV Peak Grad:      9.4 mmHg AV Mean Grad:      5.0 mmHg LVOT Vmax:         111.50 cm/s LVOT Vmean:        78.700 cm/s LVOT VTI:          0.270 m LVOT/AV VTI ratio: 0.79  AORTA Ao Root diam: 3.70 cm Ao Asc diam:  3.60 cm MITRAL VALVE                TRICUSPID VALVE MV Area (PHT): 2.84 cm     TR Peak grad:   22.3 mmHg MV Decel Time: 267 msec     TR Vmax:        236.00 cm/s MR Peak grad: 56.9 mmHg MR Vmax:      377.00 cm/s   SHUNTS MV E velocity: 74.10 cm/s   Systemic VTI:  0.27 m MV A velocity: 108.00 cm/s  Systemic Diam: 2.10 cm MV E/A ratio:  0.69 Lyman Bishop MD Electronically signed by Lyman Bishop MD Signature Date/Time: 06/12/2022/12:31:40 PM    Final    MR BRAIN WO CONTRAST  Result Date: 06/12/2022 CLINICAL DATA:  Initial evaluation for acute TIA. EXAM: MRI HEAD WITHOUT CONTRAST TECHNIQUE: Multiplanar, multiecho pulse sequences of the brain and surrounding structures were obtained without intravenous contrast. COMPARISON:  Prior CTs from earlier the same day. FINDINGS: Brain: Cerebral volume within normal limits. Patchy T2/FLAIR hyperintensity involving the periventricular white matter, most consistent with chronic small vessel ischemic disease, mild for age. No evidence for acute or subacute ischemia. Gray-white matter differentiation maintained. No areas of chronic cortical infarction. No acute  or chronic intracranial blood products. No mass lesion, midline shift or mass effect. No hydrocephalus or extra-axial fluid collection. Pituitary gland and suprasellar region within normal limits. Vascular: Major intracranial vascular flow voids are maintained. Skull and upper cervical spine: Craniocervical junction within normal limits. Bone marrow signal intensity normal. No scalp soft tissue abnormality. Sinuses/Orbits: Prior ocular lens replacement on the right. Mild scattered mucosal thickening noted about the ethmoidal air cells and maxillary sinuses. No mastoid effusion. Other: None. IMPRESSION: 1. No acute intracranial abnormality. 2. Mild chronic microvascular ischemic disease for age. Electronically Signed   By: Jeannine Boga M.D.   On: 06/12/2022 02:59   CT HEAD CODE STROKE WO CONTRAST  Result Date:  06/12/2022 CLINICAL DATA:  Code stroke. EXAM: CT HEAD WITHOUT CONTRAST TECHNIQUE: Contiguous axial images were obtained from the base of the skull through the vertex without intravenous contrast. RADIATION DOSE REDUCTION: This exam was performed according to the departmental dose-optimization program which includes automated exposure control, adjustment of the mA and/or kV according to patient size and/or use of iterative reconstruction technique. COMPARISON:  Comparison made with prior CT and MRI from earlier the same day. FINDINGS: Brain: Cerebral volume within normal limits. Mild chronic microvascular ischemic disease. No acute intracranial hemorrhage. No acute large vessel territory infarct. No mass lesion, midline shift or mass effect. No hydrocephalus or extra-axial fluid collection. Vascular: No asymmetric hyperdense vessel. Calcified atherosclerosis present at skull base. Skull: Scalp soft tissues and calvarium within normal limits. Sinuses/Orbits: Globes orbital soft tissues demonstrate no acute finding. Mild mucosal thickening noted about the right maxillary sinus. Paranasal sinuses are  otherwise clear. No mastoid effusion. Other: None. ASPECTS [facility] Stroke Program Early CT Score) - Ganglionic level infarction (caudate, lentiform nuclei, internal capsule, insula, M1-M3 cortex): 7 - Supraganglionic infarction (M4-M6 cortex): 3 Total score (0-10 with 10 being normal): 10 IMPRESSION: 1. No acute intracranial abnormality. 2. ASPECTS is 10. 3. Mild chronic microvascular ischemic disease. These results were communicated to Dr. Cheral Marker at 2:42 am on 06/12/2022 by text page via the [facility] messaging system. Electronically Signed   By: Jeannine Boga M.D.   On: 06/12/2022 02:43    PHYSICAL EXAM General: Pleasant, well-appearing  in bed. No acute distress. Skin: Warm and dry. No obvious rash or lesions. Neuro: A&Ox3. CN II-XII intact. No cerebellar abnormalities. Moves all extremities. Normal ROM. 5/5 strength in RUE, LUE, RLE, and LLE. Normal sensation. No focal deficit. No dysarthria. Psych: Normal mood and affect   ASSESSMENT/PLAN Roy Moody is an 81 y.o. male with a PMHx of skin cancer, migraine headaches, HTN, cataracts and arthritis who presented to [place] on Monday AM after having noted unsteady gait and left sided weakness that had spontaneously resolved prior to arrival.  Orthostasis vs. TIA  Code Stroke CT head No acute abnormality. ASPECTS 10.    CTA head & neck Severe multifocal atherosclerotic stenosis involving the right A1 segment, right SCA, and right PICA. Moderate stenosis of the V4 segments of the bilateral vertebral arteries. MRI No acute abnormality 2D Echo EF 55-60% LDL 113 HgbA1c 5.8 VTE prophylaxis - lovenox No antithrombotic prior to admission, now on aspirin 81 mg daily and clopidogrel 75 mg daily DAPT for three weeks and then aspirin '81mg'$  alone afterwards. Therapy recommendations:  none Disposition:  home  Hypertension Home meds:  amlodipine, labetalol Unstable Gradually normalize in 2-3 days Long-term BP goal  normotensive  Hyperlipidemia Home meds:  none LDL 113, goal < 70 Add lipitor '40mg'$   Continue statin at discharge  Other Stroke Risk Factors Advanced Age >/= 34  Migraines  Other Active Problems   Hospital day # 0  Stormy Fabian, MD PGY-1 Psychiatry  ATTENDING NOTE: I reviewed above note and agree with the assessment and plan. Pt was seen and examined.   81 year old male with history of hypertension, migraine, neck pain admitted for episode of imbalance gait, left leg weakness versus incoordination.  Currently symptoms all resolved.  CT no acute abnormality.  CTA head and neck left V4 atherosclerosis, right SCA and PICA stenosis.  MRI no acute infarct.  EF 55 to 60%, LDL 113, A1c 5.8, creatinine 0.89.  BP elevated.  On exam, no family at bedside.  Patient  neurologic intact, no focal deficit.  Etiology for patient episode not quite clear, patient certainly has posterior circulation stenosis but more on the right SCA and PICA, which cannot explain left leg weakness or incoordination.  Patient episode started at midnight while he get up going to bathroom, orthostasis is in differential.  Given his stroke risk factors and intracranial vascular stenosis, would treat him as a TIA.  Continue DAPT for 3 weeks and then aspirin alone.  Continue Lipitor 40.  PT/OT no recommendation.    For detailed assessment and plan, please refer to above/below as I have made changes wherever appropriate.   Neurology will sign off. Please call with questions. Pt will follow up with stroke clinic NP at [facility] in about 4 weeks. Thanks for the consult.   Rosalin Hawking, MD PhD Stroke Neurology 06/12/2022 3:25 PM    To contact Stroke Continuity provider, please refer to [email or web address] After hours, contact General Neurology

## 2022-06-12 NOTE — Evaluation (Addendum)
Occupational Therapy Evaluation Patient Details Name: Roy Moody MRN: [id number] DOB: 1941/06/18 Today's Date: 06/12/2022   History of Present Illness 81 y.o. male admitted with unsteady gait and left sided weakness that had spontaneously resolved prior to arrival. Undergoing TIA workup. MRI (-) for CVA. PMHx of skin cancer, migraine headaches, HTN, cataracts and arthriti   Clinical Impression   PTA pt lives at home with his wife in a [place] [employer] living [place], drives and walks @ 3-4 miles daily. Pt appears close to baseline regarding ADL and mobility. Pt states he feels "normal". Educated pt/wife on warning signs/symptoms of CVA using [employer]. Pt/wife verbalized understanding. No further OT needed.    Pt is active with outpt PT for "neck issues"   Recommendations for follow up therapy are one component of a multi-disciplinary discharge planning process, led by the attending physician.  Recommendations may be updated based on patient status, additional functional criteria and insurance authorization.   Follow Up Recommendations  No OT follow up    Assistance Recommended at Discharge Set up Supervision/Assistance  Patient can return home with the following      Functional Status Assessment  Patient has not had a recent decline in their functional status  Equipment Recommendations  None recommended by OT    Recommendations for Other Services       Precautions / Restrictions Precautions Precautions: None      Mobility Bed Mobility Overal bed mobility: Independent                  Transfers Overall transfer level: Modified independent                        Balance                                 Standardized Balance Assessment Standardized Balance Assessment : Dynamic Gait Index   Dynamic Gait Index Level Surface: Normal Change in Gait Speed: Normal Gait with Horizontal Head Turns: Normal Gait with Vertical Head  Turns: Normal Gait and Pivot Turn: Mild Impairment Step Over Obstacle: Normal Step Around Obstacles: Normal Steps: Mild Impairment Total Score: 22     ADL either performed or assessed with clinical judgement   ADL Overall ADL's : At baseline                                             Vision Baseline Vision/History: 1 Wears glasses Patient Visual Report: Diplopia (diplopia at baseline; has prism lenses; has to move his head around to not see double; closes one eye when driving) Additional Comments: at baseline; appears to be R eye dominant; L nasal portion of lens taped which appeared to help his vision. Pt/wife educated on use of partial occlusion using 1M tape     Perception     Praxis      Pertinent Vitals/Pain Pain Assessment Pain Assessment: No/denies pain     Hand Dominance Right   Extremity/Trunk Assessment Upper Extremity Assessment Upper Extremity Assessment: Overall WFL for tasks assessed   Lower Extremity Assessment Lower Extremity Assessment: Overall WFL for tasks assessed   Cervical / Trunk Assessment Cervical / Trunk Assessment: Other exceptions (forward head)   Communication Communication Communication: Other (comment) (has a stutter at baseline)   Cognition Arousal/Alertness:  Awake/alert Behavior During Therapy: WFL for tasks assessed/performed Overall Cognitive Status: Within Functional Limits for tasks assessed                                       General Comments       Exercises     Shoulder Instructions      Home Living Family/patient expects to be discharged to:: Private residence Living Arrangements: Spouse/significant other Available Help at Discharge: Family;Available 24 hours/day Type of Home: House Home Access: Level entry     Home Layout: One level     Bathroom Shower/Tub: Occupational psychologist: Standard Bathroom Accessibility: Yes How Accessible: Accessible via  walker Home Equipment: Toilet riser   Additional Comments: pt lives at [employer] in [place]      Prior Functioning/Environment Prior Level of Function : Independent/Modified Independent;Driving             Mobility Comments: enjoys walking 3-4 miles/day and hiking          OT Problem List: Impaired vision/perception      OT Treatment/Interventions:      OT Goals(Current goals can be found in the care plan section) Acute Rehab OT Goals Patient Stated Goal: to go home today OT Goal Formulation: All assessment and education complete, DC therapy  OT Frequency:      Co-evaluation              AM-PAC OT "6 Clicks" Daily Activity     Outcome Measure Help from another person eating meals?: None Help from another person taking care of personal grooming?: None Help from another person toileting, which includes using toliet, bedpan, or urinal?: None Help from another person bathing (including washing, rinsing, drying)?: None Help from another person to put on and taking off regular upper body clothing?: None Help from another person to put on and taking off regular lower body clothing?: None 6 Click Score: 24   End of Session Equipment Utilized During Treatment: Gait belt Nurse Communication: Mobility status  Activity Tolerance: Patient tolerated treatment well Patient left: in bed;with call bell/phone within reach;with family/visitor present  OT Visit Diagnosis: Unsteadiness on feet (R26.81)                Time: 1610-9604 OT Time Calculation (min): 28 min Charges:  OT General Charges $OT Visit: 1 Visit OT Evaluation $OT Eval Low Complexity: [place], OT/L   Acute OT Clinical Specialist Acute Rehabilitation Services Pager [phone] Office [phone]   [facility] 06/12/2022, 9:44 AM

## 2022-06-12 NOTE — Progress Notes (Signed)
  Echocardiogram 2D Echocardiogram has been performed.  [name] 06/12/2022, 10:45 AM

## 2022-06-12 NOTE — TOC Transition Note (Signed)
Transition of Care [facility]) - CM/SW Discharge Note   Patient Details  Name: Roy Moody MRN: [id number] Date of Birth: Nov 11, 1940  [facility]) CM/SW Contact:  Pollie Friar, RN Phone Number: 06/12/2022, 2:52 PM   Clinical Narrative:    Pt is discharging home with self care. No f/u per PT/OT and no DME needs.  Pt lives in [place] with his spouse.  They deny any transportation issues.  Pt manages his own medications and denies issues with them.  Wife providing transport home.    Final next level of care: Home/Self Care Barriers to Discharge: No Barriers Identified   Patient Goals and CMS Choice        Discharge Placement                       Discharge Plan and Services                                     Social Determinants of Health (SDOH) Interventions     Readmission Risk Interventions     No data to display

## 2022-06-12 NOTE — Progress Notes (Signed)
Patient discharged home via private vehicle with wife. All discharge instructions explained and patient expressed understanding. PIV removed and all belongings sent with patient.

## 2022-06-12 NOTE — Evaluation (Signed)
Physical Therapy Evaluation  Patient Details Name: Roy Moody MRN: [id number] DOB: 07/10/1941 Today's Date: 06/12/2022  History of Present Illness  Pt is an 81 y.o. male admitted with unsteady gait and left sided weakness that had spontaneously resolved prior to arrival. MRI negative for acute changes. PMHx of skin cancer, migraine headaches, HTN, cataracts and arthritis  Clinical Impression  Patient evaluated by Physical Therapy with no further acute PT needs identified. All education has been completed and the patient has no further questions. At the time of PT eval pt was able to perform transfers and ambulation with gross modified independence to min guard assist for safety. Pt with decreased awareness of safety in regards to IV lines/positioning and required supervision for safety as well as management of IV pole. When IV saline locked/removed anticipate pt will be at baseline of function. See below for any follow-up Physical Therapy or equipment needs. PT is signing off. Thank you for this referral.        Recommendations for follow up therapy are one component of a multi-disciplinary discharge planning process, led by the attending physician.  Recommendations may be updated based on patient status, additional functional criteria and insurance authorization.  Follow Up Recommendations No PT follow up      Assistance Recommended at Discharge PRN  Patient can return home with the following  A little help with walking and/or transfers;A little help with bathing/dressing/bathroom;Assistance with cooking/housework;Assist for transportation;Help with stairs or ramp for entrance    Equipment Recommendations None recommended by PT  Recommendations for Other Services       Functional Status Assessment Patient has not had a recent decline in their functional status     Precautions / Restrictions Precautions Precautions: Fall Precaution Comments: Poor awareness of  safety/lines Restrictions Weight Bearing Restrictions: No      Mobility  Bed Mobility Overal bed mobility: Independent                  Transfers Overall transfer level: Modified independent Equipment used: None               General transfer comment: No unsteadiness or LOB noted during sit<>stand.    Ambulation/Gait Ambulation/Gait assistance: Min guard Gait Distance (Feet): 300 Feet Assistive device: None Gait Pattern/deviations: Step-through pattern, Decreased stride length, Drifts right/left Gait velocity: Decreased Gait velocity interpretation: 1.31 - 2.62 ft/sec, indicative of limited community ambulator   General Gait Details: Drifting R and L in hall, poor awareness of IV lines  Stairs Stairs: Yes Stairs assistance: Supervision Stair Management: No rails, Forwards, Alternating pattern Number of Stairs: 5 General stair comments: Practice stairs in rehab gym as part of the DGI. No use of rails required. No unsteadiness noted.  Wheelchair Mobility    Modified Rankin (Stroke Patients Only) Modified Rankin (Stroke Patients Only) Pre-Morbid Rankin Score: No significant disability Modified Rankin: No significant disability     Balance Overall balance assessment: Needs assistance Sitting-balance support: Feet supported, No upper extremity supported Sitting balance-Leahy Scale: Good     Standing balance support: During functional activity, No upper extremity supported Standing balance-Leahy Scale: Fair                   Standardized Balance Assessment Standardized Balance Assessment : Dynamic Gait Index   Dynamic Gait Index Level Surface: Normal Change in Gait Speed: Normal Gait with Horizontal Head Turns: Mild Impairment Gait with Vertical Head Turns: Normal Gait and Pivot Turn: Mild Impairment Step Over Obstacle: Mild Impairment  Step Around Obstacles: Normal Steps: Normal Total Score: 21       Pertinent Vitals/Pain Pain  Assessment Pain Assessment: No/denies pain    Home Living Family/patient expects to be discharged to:: Private residence Living Arrangements: Spouse/significant other Available Help at Discharge: Family;Available 24 hours/day Type of Home: House Home Access: Level entry       Home Layout: One level Home Equipment: Toilet riser Additional Comments: pt lives at [facility] in [place]    Prior Function Prior Level of Function : Independent/Modified Independent;Driving             Mobility Comments: enjoys walking 3-4 miles/day and hiking       Hand Dominance   Dominant Hand: Right    Extremity/Trunk Assessment   Upper Extremity Assessment Upper Extremity Assessment: Overall WFL for tasks assessed    Lower Extremity Assessment Lower Extremity Assessment: Overall WFL for tasks assessed (Mild weakness grossly - age appropriate)    Cervical / Trunk Assessment Cervical / Trunk Assessment: Other exceptions Cervical / Trunk Exceptions: Forward head posture with rounded shoulders  Communication   Communication: Other (comment) (has a stutter at baseline)  Cognition Arousal/Alertness: Awake/alert Behavior During Therapy: WFL for tasks assessed/performed Overall Cognitive Status: Within Functional Limits for tasks assessed                                          General Comments      Exercises     Assessment/Plan    PT Assessment Patient does not need any further PT services  PT Problem List         PT Treatment Interventions      PT Goals (Current goals can be found in the Care Plan section)  Acute Rehab PT Goals Patient Stated Goal: Home today PT Goal Formulation: All assessment and education complete, DC therapy    Frequency       Co-evaluation               AM-PAC PT "6 Clicks" Mobility  Outcome Measure Help needed turning from your back to your side while in a flat bed without using bedrails?: None Help needed moving from  lying on your back to sitting on the side of a flat bed without using bedrails?: None Help needed moving to and from a bed to a chair (including a wheelchair)?: None Help needed standing up from a chair using your arms (e.g., wheelchair or bedside chair)?: None Help needed to walk in hospital room?: A Little Help needed climbing 3-5 steps with a railing? : A Little 6 Click Score: 22    End of Session   Activity Tolerance: Patient tolerated treatment well Patient left: in chair;with call bell/phone within reach;with chair alarm set;with family/visitor present Nurse Communication: Mobility status PT Visit Diagnosis: Unsteadiness on feet (R26.81);Other symptoms and signs involving the nervous system (R29.898)    Time: 1448-1856 PT Time Calculation (min) (ACUTE ONLY): 27 min   Charges:   PT Evaluation $PT Eval Low Complexity: 1 Low PT Treatments $Gait Training: 8-22 mins        Rolinda Roan, PT, DPT Acute Rehabilitation Services Secure Chat Preferred Office: [phone]   Thelma Comp 06/12/2022, 1:59 PM

## 2022-06-12 NOTE — Discharge Summary (Signed)
Physician Discharge Summary   Patient: Roy Moody MRN: [id number] DOB: [birth year]  Admit date:     06/11/2022  Discharge date: 06/12/22  Discharge Physician: Alma Friendly   PCP: Shon Baton, MD   Recommendations at discharge:   Follow-up with PCP in 1 week Follow-up with neurology as scheduled   Discharge Diagnoses: Principal Problem:   Transient ischemic attack (TIA) Active Problems:   CVD (cerebrovascular disease)   Essential hypertension    Hospital Course: Roy Moody is a 81 y.o. male with medical history significant of HTN. Pt presents to ED c/o L sided weakness episode from the night prior to arrival, but resolved when he woke up in the morning.  Patient was already symptom-free prior to presenting to the ED. Dr. Rory Percy called by EDP, felt pt was high risk for a posterior circulation TIA, recommended admission and formal neurology consult.     Today, patient was completely symptom-free, denied any new complaints.  Discussed extensively with patient and wife at bedside.  Follow-up with PCP in 1 week and neurology in 2 months.  Assessment and Plan:  Transient ischemic attack (TIA) CT head unremarkable CTA head and neck showed severe multifocal atherosclerotic stenosis, moderate stenosis of the V4 segments of the bilateral vertebral arteries MRI with no acute abnormality Echo showed EF of 55 to 60% Neurology consulted, recommend aspirin and clopidogrel for 3 weeks and then aspirin alone PT/OT/SLP-no further recommendations Outpatient follow-up with neurology  Hypertension Continue home amlodipine, labetalol  Hyperlipidemia LDL 113 Start Lipitor 40 mg daily  Prediabetes A1c 5.8 Outpatient follow-up with PCP    Consultants: Neurology Procedures performed: None Disposition: Home Diet recommendation:  Cardiac and Carb modified diet    DISCHARGE MEDICATION: Allergies as of 06/12/2022       Reactions   Alphagan [brimonidine] Other  (See Comments)   Eye inflammation Red eyes   Tape Other (See Comments)   Very sensitive skin, tears easily Pt OK with fabric tape   Trusopt [dorzolamide] Other (See Comments)   Eye inflammation Red eyes   Latex Rash   Contact rash "Tears skin off"        Medication List     TAKE these medications    amLODipine 5 MG tablet Commonly known as: NORVASC Take 1 tablet (5 mg total) by mouth daily.   aspirin EC 81 MG tablet Take 1 tablet (81 mg total) by mouth daily. Swallow whole. Start taking on: June 13, 2022   atorvastatin 40 MG tablet Commonly known as: LIPITOR Take 1 tablet (40 mg total) by mouth daily. Start taking on: June 13, 2022   clopidogrel 75 MG tablet Commonly known as: PLAVIX Take 1 tablet (75 mg total) by mouth daily. Start taking on: June 13, 2022   labetalol 200 MG tablet Commonly known as: NORMODYNE Take 400 mg by mouth See admin instructions. 400 mg twice daily. May take an additional 200 mg at lunch as needed for SBP > 140   loratadine 10 MG tablet Commonly known as: CLARITIN Take 10 mg by mouth in the morning.   Lumigan 0.01 % Soln Generic drug: bimatoprost Place 1 drop into both eyes every evening.   meclizine 25 MG tablet Commonly known as: ANTIVERT Take 1 tablet (25 mg total) by mouth 3 (three) times daily as needed for dizziness.   Rhopressa 0.02 % Soln Generic drug: Netarsudil Dimesylate Place 1 drop into the right eye every evening.   timolol 0.5 % ophthalmic solution Commonly known as:  TIMOPTIC Place 1 drop into the right eye every morning.   triamcinolone cream 0.1 % Commonly known as: KENALOG Apply 1 application  topically daily as needed (ezcema).        Follow-up Information     Shon Baton, MD. Schedule an appointment as soon as possible for a visit in 1 week(s).   Specialty: Internal Medicine Contact information: [place] [address] [place]. Schedule an appointment as soon as possible for a visit in 1 month(s).   Why: stroke clinic Contact information: [address]     [place] [address] [phone]               Discharge Exam: Danley Danker Weights   06/11/22 0957  Weight: 72.9 kg   General: NAD  Cardiovascular: S1, S2 present Respiratory: CTAB Abdomen: Soft, nontender, nondistended, bowel sounds present Musculoskeletal: No bilateral pedal edema noted Skin: Normal Psychiatry: Normal mood   Condition at discharge: stable  The results of significant diagnostics from this hospitalization (including imaging, microbiology, ancillary and laboratory) are listed below for reference.   Imaging Studies: ECHOCARDIOGRAM COMPLETE  Result Date: 06/12/2022    ECHOCARDIOGRAM REPORT   Patient Name:   Roy Moody Date of Exam: 06/12/2022 Medical Rec #:  [id number]          Height:       69.5 in Accession #:    [id number]         Weight:       160.7 lb Date of Birth:  05-Oct-1940          BSA:          1.892 m Patient Age:    69 years           BP:           178/92 mmHg Patient Gender: M                  HR:           65 bpm. Exam Location:  Inpatient Procedure: 2D Echo and Intracardiac Opacification Agent Indications:    Stroke  History:        Patient has prior history of Echocardiogram examinations, most                 recent 12/04/2021. TIA; Risk Factors:Hypertension.  Sonographer:    Harvie Junior Referring Phys: [phone] JARED M GARDNER  Sonographer Comments: Technically difficult study due to poor echo windows. Image acquisition challenging due to respiratory motion. IMPRESSIONS  1. Left ventricular ejection fraction, by estimation, is 55 to 60%. The left ventricle has normal function. The left ventricle has no regional wall motion abnormalities. Left ventricular diastolic parameters are consistent with Grade I diastolic dysfunction (impaired relaxation).  2. Right ventricular systolic  function is normal. The right ventricular size is normal. There is normal pulmonary artery systolic pressure. The estimated right ventricular systolic pressure is 35.3 mmHg.  3. The mitral valve is abnormal. Trivial mitral valve regurgitation.  4. The aortic valve is tricuspid. Aortic valve regurgitation is not visualized. Aortic valve sclerosis/calcification is present, without any evidence of aortic stenosis.  5. The inferior vena cava is normal in size with greater than 50% respiratory variability, suggesting right atrial pressure of 3 mmHg. Comparison(s): No significant change from prior study. 12/04/2021: LVEF 55-60%, grade 1 DD, mild MR. FINDINGS  Left Ventricle: Left ventricular ejection fraction,  by estimation, is 55 to 60%. The left ventricle has normal function. The left ventricle has no regional wall motion abnormalities. Definity contrast agent was given IV to delineate the left ventricular  endocardial borders. The left ventricular internal cavity size was normal in size. There is no left ventricular hypertrophy. Left ventricular diastolic parameters are consistent with Grade I diastolic dysfunction (impaired relaxation). Indeterminate filling pressures. Right Ventricle: The right ventricular size is normal. No increase in right ventricular wall thickness. Right ventricular systolic function is normal. There is normal pulmonary artery systolic pressure. The tricuspid regurgitant velocity is 2.36 m/s, and  with an assumed right atrial pressure of 3 mmHg, the estimated right ventricular systolic pressure is 02.6 mmHg. Left Atrium: Left atrial size was normal in size. Right Atrium: Right atrial size was normal in size. Pericardium: There is no evidence of pericardial effusion. Mitral Valve: The mitral valve is abnormal. Mild mitral annular calcification. Trivial mitral valve regurgitation. Tricuspid Valve: The tricuspid valve is grossly normal. Tricuspid valve regurgitation is trivial. Aortic Valve: The  aortic valve is tricuspid. Aortic valve regurgitation is not visualized. Aortic valve sclerosis/calcification is present, without any evidence of aortic stenosis. Aortic valve mean gradient measures 5.0 mmHg. Aortic valve peak gradient measures 9.4 mmHg. Aortic valve area, by VTI measures 2.74 cm. Pulmonic Valve: The pulmonic valve was normal in structure. Pulmonic valve regurgitation is not visualized. Aorta: The aortic root and ascending aorta are structurally normal, with no evidence of dilitation. Venous: The inferior vena cava is normal in size with greater than 50% respiratory variability, suggesting right atrial pressure of 3 mmHg. IAS/Shunts: No atrial level shunt detected by color flow Doppler.  LEFT VENTRICLE PLAX 2D LVIDd:         4.40 cm     Diastology LVIDs:         3.20 cm     LV e' medial:    8.70 cm/s LV PW:         1.00 cm     LV E/e' medial:  8.5 LV IVS:        1.00 cm     LV e' lateral:   8.05 cm/s LVOT diam:     2.10 cm     LV E/e' lateral: 9.2 LV SV:         94 LV SV Index:   49 LVOT Area:     3.46 cm  LV Volumes (MOD) LV vol d, MOD A2C: 68.4 ml LV vol d, MOD A4C: 86.9 ml LV vol s, MOD A2C: 33.5 ml LV vol s, MOD A4C: 36.8 ml LV SV MOD A2C:     34.9 ml LV SV MOD A4C:     86.9 ml LV SV MOD BP:      45.8 ml RIGHT VENTRICLE RV Basal diam:  3.30 cm RV Mid diam:    2.50 cm RV S prime:     14.10 cm/s TAPSE (M-mode): 3.0 cm LEFT ATRIUM             Index        RIGHT ATRIUM           Index LA diam:        3.20 cm 1.69 cm/m   RA Area:     16.20 cm LA Vol (A2C):   43.5 ml 22.99 ml/m  RA Volume:   41.30 ml  21.83 ml/m LA Vol (A4C):   41.7 ml 22.04 ml/m LA Biplane Vol: 44.2 ml 23.36 ml/m  AORTIC  VALVE                     PULMONIC VALVE AV Area (Vmax):    2.52 cm      PV Vmax:       0.93 m/s AV Area (Vmean):   2.67 cm      PV Peak grad:  3.5 mmHg AV Area (VTI):     2.74 cm AV Vmax:           153.00 cm/s AV Vmean:          102.000 cm/s AV VTI:            0.341 m AV Peak Grad:      9.4 mmHg AV Mean  Grad:      5.0 mmHg LVOT Vmax:         111.50 cm/s LVOT Vmean:        78.700 cm/s LVOT VTI:          0.270 m LVOT/AV VTI ratio: 0.79  AORTA Ao Root diam: 3.70 cm Ao Asc diam:  3.60 cm MITRAL VALVE                TRICUSPID VALVE MV Area (PHT): 2.84 cm     TR Peak grad:   22.3 mmHg MV Decel Time: 267 msec     TR Vmax:        236.00 cm/s MR Peak grad: 56.9 mmHg MR Vmax:      377.00 cm/s   SHUNTS MV E velocity: 74.10 cm/s   Systemic VTI:  0.27 m MV A velocity: 108.00 cm/s  Systemic Diam: 2.10 cm MV E/A ratio:  0.69 Lyman Bishop MD Electronically signed by Lyman Bishop MD Signature Date/Time: 06/12/2022/12:31:40 PM    Final    MR BRAIN WO CONTRAST  Result Date: 06/12/2022 CLINICAL DATA:  Initial evaluation for acute TIA. EXAM: MRI HEAD WITHOUT CONTRAST TECHNIQUE: Multiplanar, multiecho pulse sequences of the brain and surrounding structures were obtained without intravenous contrast. COMPARISON:  Prior CTs from earlier the same day. FINDINGS: Brain: Cerebral volume within normal limits. Patchy T2/FLAIR hyperintensity involving the periventricular white matter, most consistent with chronic small vessel ischemic disease, mild for age. No evidence for acute or subacute ischemia. Gray-white matter differentiation maintained. No areas of chronic cortical infarction. No acute or chronic intracranial blood products. No mass lesion, midline shift or mass effect. No hydrocephalus or extra-axial fluid collection. Pituitary gland and suprasellar region within normal limits. Vascular: Major intracranial vascular flow voids are maintained. Skull and upper cervical spine: Craniocervical junction within normal limits. Bone marrow signal intensity normal. No scalp soft tissue abnormality. Sinuses/Orbits: Prior ocular lens replacement on the right. Mild scattered mucosal thickening noted about the ethmoidal air cells and maxillary sinuses. No mastoid effusion. Other: None. IMPRESSION: 1. No acute intracranial abnormality. 2. Mild  chronic microvascular ischemic disease for age. Electronically Signed   By: Jeannine Boga M.D.   On: 06/12/2022 02:59   CT HEAD CODE STROKE WO CONTRAST  Result Date: 06/12/2022 CLINICAL DATA:  Code stroke. EXAM: CT HEAD WITHOUT CONTRAST TECHNIQUE: Contiguous axial images were obtained from the base of the skull through the vertex without intravenous contrast. RADIATION DOSE REDUCTION: This exam was performed according to the departmental dose-optimization program which includes automated exposure control, adjustment of the mA and/or kV according to patient size and/or use of iterative reconstruction technique. COMPARISON:  Comparison made with prior CT and MRI from earlier the same day. FINDINGS: Brain: Cerebral  volume within normal limits. Mild chronic microvascular ischemic disease. No acute intracranial hemorrhage. No acute large vessel territory infarct. No mass lesion, midline shift or mass effect. No hydrocephalus or extra-axial fluid collection. Vascular: No asymmetric hyperdense vessel. Calcified atherosclerosis present at skull base. Skull: Scalp soft tissues and calvarium within normal limits. Sinuses/Orbits: Globes orbital soft tissues demonstrate no acute finding. Mild mucosal thickening noted about the right maxillary sinus. Paranasal sinuses are otherwise clear. No mastoid effusion. Other: None. ASPECTS [facility] Stroke Program Early CT Score) - Ganglionic level infarction (caudate, lentiform nuclei, internal capsule, insula, M1-M3 cortex): 7 - Supraganglionic infarction (M4-M6 cortex): 3 Total score (0-10 with 10 being normal): 10 IMPRESSION: 1. No acute intracranial abnormality. 2. ASPECTS is 10. 3. Mild chronic microvascular ischemic disease. These results were communicated to Dr. Cheral Marker at 2:42 am on 06/12/2022 by text page via the [facility] messaging system. Electronically Signed   By: Jeannine Boga M.D.   On: 06/12/2022 02:43   CT ANGIO HEAD NECK W WO CM  Result Date:  06/11/2022 CLINICAL DATA:  Stroke like symptoms EXAM: CT ANGIOGRAPHY HEAD AND NECK TECHNIQUE: Multidetector CT imaging of the head and neck was performed using the standard protocol during bolus administration of intravenous contrast. Multiplanar CT image reconstructions and MIPs were obtained to evaluate the vascular anatomy. Carotid stenosis measurements (when applicable) are obtained utilizing NASCET criteria, using the distal internal carotid diameter as the denominator. RADIATION DOSE REDUCTION: This exam was performed according to the departmental dose-optimization program which includes automated exposure control, adjustment of the mA and/or kV according to patient size and/or use of iterative reconstruction technique. CONTRAST:  36m OMNIPAQUE IOHEXOL 350 MG/ML SOLN COMPARISON:  None Available. FINDINGS: CT HEAD FINDINGS: See same day CT Brain Other: Prominent left level 1B lymph node measuring 7 mm (series 80 5, image 54). Review of the MIP images confirms the above findings CTA NECK FINDINGS Aortic arch: Standard branching. Imaged portion shows no evidence of aneurysm or dissection. No significant stenosis of the major arch vessel origins. Right carotid system: 2 mm medially projecting outpouching arising from the supraclinoid ICA on the right (series 7, image 119). Left carotid system: No evidence of dissection, stenosis (50% or greater), or occlusion. Vertebral arteries: Moderate stenosis of the V4 segment of the right vertebral artery (series 7, image 153).Moderate stenosis of the V4 segment of the left vertebral artery (series 7, image 146). Skeleton: Negative. Other neck: 6 mm right thyroid nodule (series 5, image 26). Upper chest: Negative. Review of the MIP images confirms the above findings CTA HEAD FINDINGS Anterior circulation: Severe multifocal stenosis of the right A1 segment. The A2 segments bilaterally are contrast opacified without stenosis. Anteriorly projecting outpouching at the  paraclinoid ICA on the right measuring 2 mm (series 101, image 124). Posterior circulation: Moderate to severe stenoses in the proximal right SCA.Mild stenosis at the P1/P2 junction on the right. Severe stenosis and likely focal occlusion of the right PICA. Venous sinuses: As permitted by contrast timing, patent. Anatomic variants: None Review of the MIP images confirms the above findings IMPRESSION: 1. Severe multifocal atherosclerotic stenosis involving the right A1 segment, right SCA, and right PICA. 2. Moderate stenosis of the V4 segments of the bilateral vertebral arteries. 3. 2 mm medially projecting outpouching arising from the supraclinoid ICA on the right, which could represent a small aneurysm or infundibulum. 4. See same day CT brain for additional findings. Electronically Signed   By: HMarin RobertsM.D.   On: 06/11/2022 12:58  CT HEAD WO CONTRAST  Result Date: 06/11/2022 CLINICAL DATA:  TIA EXAM: CT HEAD WITHOUT CONTRAST TECHNIQUE: Contiguous axial images were obtained from the base of the skull through the vertex without intravenous contrast. RADIATION DOSE REDUCTION: This exam was performed according to the departmental dose-optimization program which includes automated exposure control, adjustment of the mA and/or kV according to patient size and/or use of iterative reconstruction technique. COMPARISON:  12/03/2021 FINDINGS: Brain: No acute intracranial findings are seen. There are no signs of bleeding within the cranium. Ventricles are not dilated. Cortical sulci are prominent. There is no focal edema or mass effect. Vascular: Scattered arterial calcifications are seen. Ectasia of suprasellar left ICA has not changed. Skull: Unremarkable. Sinuses/Orbits: There is mucosal thickening in right maxillary sinus. Other: None. IMPRESSION: No acute intracranial findings are seen in noncontrast CT brain. Atrophy. Mild chronic sinusitis. Electronically Signed   By: Elmer Picker M.D.   On:  06/11/2022 10:27   XR Cervical Spine 2 or 3 views  Result Date: 05/16/2022 2 views of the cervical spine are reviewed today.  No evidence of any lesions or abnormalities in his lung fields.  He has no evidence of a listhesis of the cervical spine.  He does have degenerative changes with calcification of the anterior ligament at C5-6 and C6-7.  Sclerotic changes and loss of joint space at these 2 levels as well especially C6-C7.  No acute fractures or other osseous abnormalities are noted.  No straightening of the cervical spine on the lateral view   Microbiology: Results for orders placed or performed during the hospital encounter of 12/03/21  Resp Panel by RT-PCR (Flu A&B, Covid) Nasopharyngeal Swab     Status: None   Collection Time: 12/03/21  9:50 AM   Specimen: Nasopharyngeal Swab; Nasopharyngeal(NP) swabs in vial transport medium  Result Value Ref Range Status   SARS Coronavirus 2 by RT PCR NEGATIVE NEGATIVE Final    Comment: (NOTE) SARS-CoV-2 target nucleic acids are NOT DETECTED.  The SARS-CoV-2 RNA is generally detectable in upper respiratory specimens during the acute phase of infection. The lowest concentration of SARS-CoV-2 viral copies this assay can detect is 138 copies/mL. A negative result does not preclude SARS-Cov-2 infection and should not be used as the sole basis for treatment or other patient management decisions. A negative result may occur with  improper specimen collection/handling, submission of specimen other than nasopharyngeal swab, presence of viral mutation(s) within the areas targeted by this assay, and inadequate number of viral copies(<138 copies/mL). A negative result must be combined with clinical observations, patient history, and epidemiological information. The expected result is Negative.  Fact Sheet for Patients:  EntrepreneurPulse.com.au  Fact Sheet for Healthcare Providers:  IncredibleEmployment.be  This  test is no t yet approved or cleared by the [place] FDA and  has been authorized for detection and/or diagnosis of SARS-CoV-2 by FDA under an Emergency Use Authorization (EUA). This EUA will remain  in effect (meaning this test can be used) for the duration of the COVID-19 declaration under Section 564(b)(1) of the Act, 21 U.S.C.section 360bbb-3(b)(1), unless the authorization is terminated  or revoked sooner.       Influenza A by PCR NEGATIVE NEGATIVE Final   Influenza B by PCR NEGATIVE NEGATIVE Final    Comment: (NOTE) The Xpert Xpress SARS-CoV-2/FLU/RSV plus assay is intended as an aid in the diagnosis of influenza from Nasopharyngeal swab specimens and should not be used as a sole basis for treatment. Nasal washings and aspirates are  unacceptable for Xpert Xpress SARS-CoV-2/FLU/RSV testing.  Fact Sheet for Patients: EntrepreneurPulse.com.au  Fact Sheet for Healthcare Providers: IncredibleEmployment.be  This test is not yet approved or cleared by the [place] FDA and has been authorized for detection and/or diagnosis of SARS-CoV-2 by FDA under an Emergency Use Authorization (EUA). This EUA will remain in effect (meaning this test can be used) for the duration of the COVID-19 declaration under Section 564(b)(1) of the Act, 21 U.S.C. section 360bbb-3(b)(1), unless the authorization is terminated or revoked.  Performed at [facility], [address] [place], [place] [address]     Labs: CBC: Recent Labs  Lab 06/11/22 1009  WBC 7.7  NEUTROABS 4.7  HGB 14.1  HCT 42.6  MCV 88.2  PLT 100   Basic Metabolic Panel: Recent Labs  Lab 06/11/22 1009  NA 140  K 4.7  CL 106  CO2 25  GLUCOSE 137*  BUN 23  CREATININE 0.89  CALCIUM 9.3   Liver Function Tests: Recent Labs  Lab 06/11/22 1009  AST 18  ALT 13  ALKPHOS 70  BILITOT 0.5  PROT 6.8  ALBUMIN 4.3   CBG: Recent Labs  Lab 06/11/22 1000  06/12/22 0205  GLUCAP 132* 111*    Discharge time spent: less than 30 minutes.  Signed: Alma Friendly, MD [facility] 06/12/2022

## 2022-06-12 NOTE — Code Documentation (Addendum)
Responded to Code Stroke called at 0208 for aphasia, [id number]. Per RN, pt had been given 0.'5mg'$  ativan at 2226, prior to his MRI scan tonight. On return from MRI, pt was alert and oriented, able move all extremities equally, and answer questions appropriately. At 0205 this AM, pt was found to have expressive aphasia, NIH-6. CBG-111, SBP-160s. Code stroke was paged out at 0208. On assessment at 0210, pt was no longer aphasic, NIH-0. CT scan negative. Pt placed on TIA alert. Plan VS/neuro checks q2h x 12, then q4h. Please perform swallow study prior to PO intake.

## 2022-06-12 NOTE — Consult Note (Signed)
NEURO HOSPITALIST CONSULT NOTE   Requestig physician: Dr. Rogers Blocker  Reason for Consult: TIA symptoms  History obtained from:  Patient and Chart     HPI:                                                                                                                                          LOCKLAN CANOY is an 81 y.o. male with a PMHx of skin cancer, migraine headaches, HTN, cataracts and arthritis who presented to [facility] on Monday AM after having noted unsteady gait and left sided weakness that had spontaneously resolved prior to arrival. He had gone to bed at 10 PM Sunday when he was LKN. He woke up at 1 AM to use the bathroom and noted at that time that he had to hold on to the walls to make it there and was leaning to the left. He woke up again at 4 AM with the same symptoms. Symptoms were resolved when he woke up again at 6:30 AM.  He has had vertigo in the past but with the episode described above he did not have any associated vertigo. Primary concern was for posterior circulation TIA. Given elevated ABCD2 score, he was sent to [facility] for TIA work up. On initial evaluation by Hospitalist here at [facility], his neurological exam was unremarkable.   Code Stroke called on the floor at 2:00 AM for speech changes noted by RN and verified by Hospitalist. LKN 12:30 AM.   Past Medical History:  Diagnosis Date   Allergy    contact allergens   Arthritis    Cancer ([place])    skin cancer   Cataract    removed    Glaucoma    History of migraine headaches    Hypertension     Past Surgical History:  Procedure Laterality Date   COLONOSCOPY     PANCREAS SURGERY  2008   POLYPECTOMY     skin cancer removal     several   UPPER GASTROINTESTINAL ENDOSCOPY     VEIN LIGATION Right 2000   leg    Family History  Problem Relation Age of Onset   Colon cancer Father 30   Colon polyps Neg Hx    Esophageal cancer Neg Hx    Rectal cancer Neg Hx    Stomach cancer Neg Hx               Social History:  reports that he has never smoked. He has never used smokeless tobacco. He reports current alcohol use of about 7.0 standard drinks of alcohol per week. He reports that he does not use drugs.  Allergies  Allergen Reactions   Brimonidine    Brimonidine Tartrate    Dorzolamide    Latex  Tape     MEDICATIONS:                                                                                                                     Prior to Admission:  Medications Prior to Admission  Medication Sig Dispense Refill Last Dose   amLODipine (NORVASC) 5 MG tablet Take 1 tablet (5 mg total) by mouth daily. 30 tablet 0    labetalol (NORMODYNE) 200 MG tablet Take 400 mg by mouth as directed. TAKE 2 TABLETS TWICE DAILY AND 1 EXTRA TABLET AT LUNCH AS NEEDED FOR SBP>140.      loratadine (CLARITIN) 10 MG tablet Take 10 mg by mouth in the morning.      LUMIGAN 0.01 % SOLN Place 1 drop into both eyes every evening.      meclizine (ANTIVERT) 25 MG tablet Take 1 tablet (25 mg total) by mouth 3 (three) times daily as needed for dizziness. 30 tablet 0    Netarsudil Dimesylate (RHOPRESSA) 0.02 % SOLN Place 1 drop into the right eye every evening.      timolol (TIMOPTIC) 0.5 % ophthalmic solution Place 1 drop into the right eye every morning.      triamcinolone cream (KENALOG) 0.1 % 1 application. daily as needed (ezcema).      Scheduled:   stroke: early stages of recovery book   Does not apply Once   aspirin EC  81 mg Oral Daily   atorvastatin  80 mg Oral Daily   clopidogrel  75 mg Oral Daily   enoxaparin (LOVENOX) injection  40 mg Subcutaneous Q24H   sodium chloride flush  3 mL Intravenous Once   Continuous:  sodium chloride       ROS:                                                                                                                                       The patient is not currently complaining of any symptoms while in CT performed as part of the Code Stroke  protocol following acute onset of aphasia on the floor.    Blood pressure (!) 198/85, pulse 66, temperature (!) 97.4 F (36.3 C), temperature source Oral, resp. rate 18, height 5' 9.5" (1.765 m), weight 72.9 kg, SpO2 95 %.   General Examination:  Physical Exam  HEENT-  /AT    Lungs-Respirations unlabored Extremities- No edema   Neurological Examination Mental Status: Awake and alert. Oriented to self, hospital, city, state, day of the week, the year and the month. Able to correctly recall several presidents backwards from [place] in the correct order. Speech is fluent with intact comprehension and naming. Repetition intact. No dysarthria.  Cranial Nerves: II: Temporal visual fields intact with no extinction to DSS. PERRL  III,IV, VI: No ptosis. EOMI.  V: Temp sensation equal bilaterally  VII: Smile symmetric VIII: Hearing intact to voice IX,X: No hoarseness XI: Symmetric shoulder shrug XII: Midline tongue extension Motor: BUE 5/5 proximally and distally BLE 5/5 proximally and distally  No pronator drift.  Sensory: Temp and light touch intact throughout, bilaterally. No extinction to DSS.  Deep Tendon Reflexes: 2+ and symmetric throughout Cerebellar: No ataxia with FNF bilaterally  Gait: Deferred   Lab Results: Basic Metabolic Panel: Recent Labs  Lab 06/11/22 1009  NA 140  K 4.7  CL 106  CO2 25  GLUCOSE 137*  BUN 23  CREATININE 0.89  CALCIUM 9.3    CBC: Recent Labs  Lab 06/11/22 1009  WBC 7.7  NEUTROABS 4.7  HGB 14.1  HCT 42.6  MCV 88.2  PLT 226    Cardiac Enzymes: No results for input(s): "CKTOTAL", "CKMB", "CKMBINDEX", "TROPONINI" in the last 168 hours.  Lipid Panel: No results for input(s): "CHOL", "TRIG", "HDL", "CHOLHDL", "VLDL", "LDLCALC" in the last 168 hours.  Imaging: CT ANGIO HEAD NECK W WO CM  Result Date: 06/11/2022 CLINICAL DATA:   Stroke like symptoms EXAM: CT ANGIOGRAPHY HEAD AND NECK TECHNIQUE: Multidetector CT imaging of the head and neck was performed using the standard protocol during bolus administration of intravenous contrast. Multiplanar CT image reconstructions and MIPs were obtained to evaluate the vascular anatomy. Carotid stenosis measurements (when applicable) are obtained utilizing NASCET criteria, using the distal internal carotid diameter as the denominator. RADIATION DOSE REDUCTION: This exam was performed according to the departmental dose-optimization program which includes automated exposure control, adjustment of the mA and/or kV according to patient size and/or use of iterative reconstruction technique. CONTRAST:  61m OMNIPAQUE IOHEXOL 350 MG/ML SOLN COMPARISON:  None Available. FINDINGS: CT HEAD FINDINGS: See same day CT Brain Other: Prominent left level 1B lymph node measuring 7 mm (series 80 5, image 54). Review of the MIP images confirms the above findings CTA NECK FINDINGS Aortic arch: Standard branching. Imaged portion shows no evidence of aneurysm or dissection. No significant stenosis of the major arch vessel origins. Right carotid system: 2 mm medially projecting outpouching arising from the supraclinoid ICA on the right (series 7, image 119). Left carotid system: No evidence of dissection, stenosis (50% or greater), or occlusion. Vertebral arteries: Moderate stenosis of the V4 segment of the right vertebral artery (series 7, image 153).Moderate stenosis of the V4 segment of the left vertebral artery (series 7, image 146). Skeleton: Negative. Other neck: 6 mm right thyroid nodule (series 5, image 26). Upper chest: Negative. Review of the MIP images confirms the above findings CTA HEAD FINDINGS Anterior circulation: Severe multifocal stenosis of the right A1 segment. The A2 segments bilaterally are contrast opacified without stenosis. Anteriorly projecting outpouching at the paraclinoid ICA on the right  measuring 2 mm (series 101, image 124). Posterior circulation: Moderate to severe stenoses in the proximal right SCA.Mild stenosis at the P1/P2 junction on the right. Severe stenosis and likely focal occlusion of the right PICA. Venous sinuses: As permitted  by contrast timing, patent. Anatomic variants: None Review of the MIP images confirms the above findings IMPRESSION: 1. Severe multifocal atherosclerotic stenosis involving the right A1 segment, right SCA, and right PICA. 2. Moderate stenosis of the V4 segments of the bilateral vertebral arteries. 3. 2 mm medially projecting outpouching arising from the supraclinoid ICA on the right, which could represent a small aneurysm or infundibulum. 4. See same day CT brain for additional findings. Electronically Signed   By: Marin Roberts M.D.   On: 06/11/2022 12:58   CT HEAD WO CONTRAST  Result Date: 06/11/2022 CLINICAL DATA:  TIA EXAM: CT HEAD WITHOUT CONTRAST TECHNIQUE: Contiguous axial images were obtained from the base of the skull through the vertex without intravenous contrast. RADIATION DOSE REDUCTION: This exam was performed according to the departmental dose-optimization program which includes automated exposure control, adjustment of the mA and/or kV according to patient size and/or use of iterative reconstruction technique. COMPARISON:  12/03/2021 FINDINGS: Brain: No acute intracranial findings are seen. There are no signs of bleeding within the cranium. Ventricles are not dilated. Cortical sulci are prominent. There is no focal edema or mass effect. Vascular: Scattered arterial calcifications are seen. Ectasia of suprasellar left ICA has not changed. Skull: Unremarkable. Sinuses/Orbits: There is mucosal thickening in right maxillary sinus. Other: None. IMPRESSION: No acute intracranial findings are seen in noncontrast CT brain. Atrophy. Mild chronic sinusitis. Electronically Signed   By: Elmer Picker M.D.   On: 06/11/2022 10:27     Assessment:  81 y.o. male with a PMHx of skin cancer, migraine headaches, HTN, cataracts and arthritis who presented to [facility] on Monday AM after having noted unsteady gait and left sided weakness that had spontaneously resolved prior to arrival. He had gone to bed at 10 PM Sunday when he was LKN. He woke up at 1 AM to use the bathroom and noted at that time that he had to hold on to the walls to make it there and was leaning to the left. He woke up again at 4 AM with the same symptoms. Symptoms were resolved when he woke up again at 6:30 AM.  He has had vertigo in the past but with the episode described above he did not have any associated vertigo. Primary concern was for posterior circulation TIA. Given elevated ABCD2 score, he was sent to [facility] for TIA work up. On initial evaluation by Hospitalist here at [facility], his neurological exam was unremarkable. Code Stroke called on the floor at 2:00 AM for speech changes noted by RN and verified by Hospitalist. LKN 12:30 AM.  - Exam in CT following Code Stroke called on the floor at 2:00 AM: Nonfocal.  - MRI brain completed with official report pending. Preliminary review of the images reveals no acute abnormality.  - ABCD2 score is 6.  - CTA of head and neck: Severe multifocal atherosclerotic stenosis involving the right A1 segment, right SCA, and right PICA. Moderate stenosis of the V4 segments of the bilateral vertebral arteries. 2 mm medially projecting outpouching arising from the supraclinoid ICA on the right, which could represent a small aneurysm or infundibulum  Recommendations: - Agree with starting max dose statin. - Permissive HTN.  - Full stroke work up to include TTE and cardiac telemetry. May need Zio patch at discharge.  - Agree with ASA 81 mg qd + Plavix 75 mg for 21 days, followed by ASA monotherapy.  - Given severe ICAD, he may need higher long term BP goals than 120/80. Will  defer to Stroke Team regarding medium and long-term BP goals. -  HgbA1c, fasting lipid panel - PT consult, OT consult, Speech consult - Telemetry monitoring - Frequent neuro checks - NPO until passes stroke swallow screen   Electronically signed: Dr. Kerney Elbe 06/12/2022, 2:13 AM

## 2022-06-13 ENCOUNTER — Encounter: Payer: Medicare PPO | Admitting: Rehabilitative and Restorative Service Providers"

## 2022-06-13 ENCOUNTER — Other Ambulatory Visit (HOSPITAL_BASED_OUTPATIENT_CLINIC_OR_DEPARTMENT_OTHER): Payer: Self-pay

## 2022-06-13 MED ORDER — INFLUENZA VAC A&B SA ADJ QUAD 0.5 ML IM PRSY
PREFILLED_SYRINGE | INTRAMUSCULAR | 0 refills | Status: AC
  Filled 2022-06-13: qty 0.5, 1d supply, fill #0

## 2022-06-13 MED ORDER — COMIRNATY 30 MCG/0.3ML IM SUSY
PREFILLED_SYRINGE | INTRAMUSCULAR | 0 refills | Status: AC
  Filled 2022-06-13: qty 0.3, 1d supply, fill #0

## 2022-06-15 DIAGNOSIS — E785 Hyperlipidemia, unspecified: Secondary | ICD-10-CM | POA: Diagnosis not present

## 2022-06-15 DIAGNOSIS — Z8679 Personal history of other diseases of the circulatory system: Secondary | ICD-10-CM | POA: Diagnosis not present

## 2022-06-15 DIAGNOSIS — I10 Essential (primary) hypertension: Secondary | ICD-10-CM | POA: Diagnosis not present

## 2022-06-15 DIAGNOSIS — Z8673 Personal history of transient ischemic attack (TIA), and cerebral infarction without residual deficits: Secondary | ICD-10-CM | POA: Diagnosis not present

## 2022-06-20 ENCOUNTER — Encounter: Payer: Medicare PPO | Admitting: Physical Therapy

## 2022-06-21 ENCOUNTER — Other Ambulatory Visit (HOSPITAL_BASED_OUTPATIENT_CLINIC_OR_DEPARTMENT_OTHER): Payer: Self-pay

## 2022-06-21 MED ORDER — AREXVY 120 MCG/0.5ML IM SUSR
INTRAMUSCULAR | 0 refills | Status: AC
  Filled 2022-06-21: qty 0.5, 1d supply, fill #0

## 2022-06-27 ENCOUNTER — Encounter: Payer: Medicare PPO | Admitting: Physical Therapy

## 2022-07-04 ENCOUNTER — Encounter: Payer: Medicare PPO | Admitting: Physical Therapy

## 2022-07-11 ENCOUNTER — Encounter: Payer: Medicare PPO | Admitting: Physical Therapy

## 2022-07-16 NOTE — Progress Notes (Unsigned)
[facility] [address] [place] [address] [phone]       HOSPITAL FOLLOW UP NOTE  Mr. Roy Moody Date of Birth:  07-10-1941 Medical Record Number:  [id number]   Reason for Referral:  hospital stroke follow up    SUBJECTIVE:   CHIEF COMPLAINT:  No chief complaint on file.   HPI:   Roy Moody is an 81 y.o. male with a PMHx of skin cancer, migraine headaches, HTN, cataracts and arthritis who presented on 06/11/2022 after having noted unsteady gait and left sided weakness that had spontaneously resolved prior to arrival. CT no acute abnormality.  CTA head and neck left V4 atherosclerosis, right SCA and PICA stenosis.  MRI no acute infarct.  EF 55 to 60%, LDL 113, A1c 5.8, creatinine 0.89.  BP elevated.  Evaluated by Dr. Erlinda Hong, neurologically intact without focal deficit.  Etiology for symptoms possibly TIA vs orthostasis (occurred when he got up in the middle the night to go to the bathroom).  Recommended DAPT for 3 weeks and aspirin alone as well as initiated atorvastatin 40 mg daily.  He was discharged home without therapy needs.         PERTINENT IMAGING  Per hospitalization 06/11/2022 - *** Code Stroke CT head No acute abnormality. ASPECTS 10.    CTA head & neck Severe multifocal atherosclerotic stenosis involving the right A1 segment, right SCA, and right PICA. Moderate stenosis of the V4 segments of the bilateral vertebral arteries. MRI No acute abnormality 2D Echo EF 55-60% LDL 113 HgbA1c 5.8    ROS:   14 system review of systems performed and negative with exception of ***  PMH:  Past Medical History:  Diagnosis Date   Allergy    contact allergens   Arthritis    Cancer ([place])    skin cancer   Cataract    removed    Glaucoma    History of migraine headaches    Hypertension     PSH:  Past Surgical History:  Procedure Laterality Date   COLONOSCOPY     PANCREAS SURGERY  2008   POLYPECTOMY     skin cancer  removal     several   UPPER GASTROINTESTINAL ENDOSCOPY     VEIN LIGATION Right 2000   leg    Social History:  Social History   Socioeconomic History   Marital status: Married    Spouse name: Not on file   Number of children: Not on file   Years of education: Not on file   Highest education level: Not on file  Occupational History   Not on file  Tobacco Use   Smoking status: Never   Smokeless tobacco: Never  Vaping Use   Vaping Use: Never used  Substance and Sexual Activity   Alcohol use: Yes    Alcohol/week: 7.0 standard drinks of alcohol    Types: 7 Glasses of wine per week   Drug use: No   Sexual activity: Not Currently  Other Topics Concern   Not on file  Social History Narrative   Not on file   Social Determinants of Health   Financial Resource Strain: Not on file  Food Insecurity: Not on file  Transportation Needs: Not on file  Physical Activity: Not on file  Stress: Not on file  Social Connections: Not on file  Intimate Partner Violence: Not on file    Family History:  Family History  Problem Relation Age of Onset   Colon cancer Father  60   Colon polyps Neg Hx    Esophageal cancer Neg Hx    Rectal cancer Neg Hx    Stomach cancer Neg Hx     Medications:   Current Outpatient Medications on File Prior to Visit  Medication Sig Dispense Refill   amLODipine (NORVASC) 5 MG tablet Take 1 tablet (5 mg total) by mouth daily. 30 tablet 0   aspirin EC 81 MG tablet Take 1 tablet (81 mg total) by mouth daily. Swallow whole. 30 tablet 0   atorvastatin (LIPITOR) 40 MG tablet Take 1 tablet (40 mg total) by mouth daily. 30 tablet 0   COVID-19 mRNA vaccine 2023-2024 (COMIRNATY) syringe Inject into the muscle. 0.3 mL 0   influenza vaccine adjuvanted (FLUAD) 0.5 ML injection Inject into the muscle. 0.5 mL 0   labetalol (NORMODYNE) 200 MG tablet Take 400 mg by mouth See admin instructions. 400 mg twice daily. May take an additional 200 mg at lunch as needed for SBP >  140     loratadine (CLARITIN) 10 MG tablet Take 10 mg by mouth in the morning.     LUMIGAN 0.01 % SOLN Place 1 drop into both eyes every evening.     meclizine (ANTIVERT) 25 MG tablet Take 1 tablet (25 mg total) by mouth 3 (three) times daily as needed for dizziness. 30 tablet 0   Netarsudil Dimesylate (RHOPRESSA) 0.02 % SOLN Place 1 drop into the right eye every evening.     RSV vaccine recomb adjuvanted (AREXVY) 120 MCG/0.5ML injection Inject into the muscle. 0.5 mL 0   timolol (TIMOPTIC) 0.5 % ophthalmic solution Place 1 drop into the right eye every morning.     triamcinolone cream (KENALOG) 0.1 % Apply 1 application  topically daily as needed (ezcema).     No current facility-administered medications on file prior to visit.    Allergies:   Allergies  Allergen Reactions   Alphagan [Brimonidine] Other (See Comments)    Eye inflammation Red eyes   Tape Other (See Comments)    Very sensitive skin, tears easily Pt OK with fabric tape   Trusopt [Dorzolamide] Other (See Comments)    Eye inflammation Red eyes   Latex Rash    Contact rash "Tears skin off"      OBJECTIVE:  Physical Exam  There were no vitals filed for this visit. There is no height or weight on file to calculate BMI. No results found.      No data to display           General: well developed, well nourished, seated, in no evident distress Head: head normocephalic and atraumatic.   Neck: supple with no carotid or supraclavicular bruits Cardiovascular: regular rate and rhythm, no murmurs Musculoskeletal: no deformity Skin:  no rash/petichiae Vascular:  Normal pulses all extremities   Neurologic Exam Mental Status: Awake and fully alert. Oriented to place and time. Recent and remote memory intact. Attention span, concentration and fund of knowledge appropriate. Mood and affect appropriate.  Cranial Nerves: Fundoscopic exam reveals sharp disc margins. Pupils equal, briskly reactive to light. Extraocular  movements full without nystagmus. Visual fields full to confrontation. Hearing intact. Facial sensation intact. Face, tongue, palate moves normally and symmetrically.  Motor: Normal bulk and tone. Normal strength in all tested extremity muscles Sensory.: intact to touch , pinprick , position and vibratory sensation.  Coordination: Rapid alternating movements normal in all extremities. Finger-to-nose and heel-to-shin performed accurately bilaterally. Gait and Station: Arises from chair without difficulty. Stance  is normal. Gait demonstrates normal stride length and balance with ***. Tandem walk and heel toe ***.  Reflexes: 1+ and symmetric. Toes downgoing.     NIHSS  *** Modified Rankin  ***      ASSESSMENT: SAMEL BRUNA is a 81 y.o. year old male with episode of TIA vs orthostasis after presenting with transient episode of unsteady gait and left-sided weakness. Vascular risk factors include multifocal arthrosclerotic stenosis, HTN, HLD, advanced age and migraine headaches.      PLAN:  Strokelike episode:  Residual deficit: ***.  Continue aspirin '81mg'$  daily and atorvastatin (Lipitor) for secondary stroke prevention.   Discussed secondary stroke prevention measures and importance of close PCP follow up for aggressive stroke risk factor management including BP goal<130/90, HLD with LDL goal<70 and DM with A1c.<7 .  Stroke labs 05/2022: LDL 113, A1c 5.8 I have gone over the pathophysiology of stroke, warning signs and symptoms, risk factors and their management in some detail with instructions to go to the closest emergency room for symptoms of concern.     Follow up in *** or call earlier if needed   CC:  [facility] provider: Dr. Leonie Man PCP: Shon Baton, MD    I spent *** minutes of face-to-face and non-face-to-face time with patient.  This included previsit chart review including review of recent hospitalization, lab review, study review, order entry, electronic health record  documentation, patient education regarding recent stroke including etiology, secondary stroke prevention measures and importance of managing stroke risk factors, residual deficits and typical recovery time and answered all other questions to patient satisfaction   Frann Rider, AGNP-BC  [facility] [address] [place], [place] [address]  Phone [phone] Fax [phone] Note: This document was prepared with digital dictation and possible smart phrase technology. Any transcriptional errors that result from this process are unintentional.

## 2022-07-17 ENCOUNTER — Ambulatory Visit: Payer: Medicare PPO | Admitting: Adult Health

## 2022-07-17 ENCOUNTER — Encounter: Payer: Self-pay | Admitting: Adult Health

## 2022-07-17 VITALS — BP 155/86 | HR 61 | Ht 69.6 in | Wt 162.2 lb

## 2022-07-17 DIAGNOSIS — Z09 Encounter for follow-up examination after completed treatment for conditions other than malignant neoplasm: Secondary | ICD-10-CM

## 2022-07-17 DIAGNOSIS — G459 Transient cerebral ischemic attack, unspecified: Secondary | ICD-10-CM | POA: Diagnosis not present

## 2022-07-17 NOTE — Patient Instructions (Addendum)
Continue aspirin 81 mg daily  and atorvastatin for secondary stroke prevention  Continue to follow up with PCP regarding blood pressure and cholesterol management  Maintain strict control of hypertension with blood pressure goal below 130/90 and cholesterol with LDL cholesterol (bad cholesterol) goal below 70 mg/dL.   Signs of a Stroke? Follow the BEFAST method:  Balance Watch for a sudden loss of balance, trouble with coordination or vertigo Eyes Is there a sudden loss of vision in one or both eyes? Or double vision?  Face: Ask the person to smile. Does one side of the face droop or is it numb?  Arms: Ask the person to raise both arms. Does one arm drift downward? Is there weakness or numbness of a leg? Speech: Ask the person to repeat a simple phrase. Does the speech sound slurred/strange? Is the person confused ? Time: If you observe any of these signs, call 911.       Thank you for coming to see [place] at [facility]. I hope we have been able to provide you high quality care today.  You may receive a patient satisfaction survey over the next few weeks. We would appreciate your feedback and comments so that we may continue to improve ourselves and the health of our patients.    Transient Ischemic Attack A transient ischemic attack (TIA) happens when blood supply to the brain is blocked temporarily. A TIA causes stroke-like symptoms that go away quickly without causing any permanent damage. Having a TIA can be considered a warning sign for a stroke and should not be ignored. A person who has a TIA is at higher risk for a stroke. What are the causes? This condition is caused by a temporary blockage in an artery in the head or neck. This means the brain does not get the blood supply it needs. A blockage can be caused by: Fatty buildup in an artery in the head or neck (atherosclerosis). A blood clot traveling from the heart. An artery tear (dissection). Inflammation of an  artery (vasculitis). Sometimes the cause is not known. What increases the risk? Certain factors make you more likely to develop this condition. Some of these are things you can change, including: Using products that contain nicotine or tobacco. Being inactive. Heavy alcohol use. Drug use, especially cocaine and methamphetamine. Medical conditions that may increase your risk include: High blood pressure (hypertension). High cholesterol. Diabetes. Heart disease (coronary artery disease). An irregular heartbeat, also called atrial fibrillation (AFib). Sickle cell disease. Blood clotting disorders (hypercoagulable state). Other risk factors include: Being over the age of 33. Being male. Obesity. Sleep problems such as sleep apnea. Family history of stroke. Previous history of blood clots, stroke, TIA, or heart attack. What are the signs or symptoms? Symptoms of a TIA are the same as those of a stroke. The symptoms develop suddenly, and then go away quickly. They may include: Dizziness, loss of balance and coordination, or trouble walking. Vision changes, such as double vision, blurred vision, or loss of vision. Weakness or numbness in your face, arm, or leg, especially on one side of your body. Trouble speaking, understanding speech, or both (aphasia). Nausea and vomiting. Severe headache. Confusion. If possible, note what time your symptoms started. Tell your health care provider. How is this diagnosed? This condition may be diagnosed based on: Your symptoms and medical history. A physical exam. Imaging tests, usually a CT scan or MRI of the brain. Blood tests. You may also have other tests, including:  Electrocardiogram (ECG). Echocardiogram. Continuous heart monitoring. Carotid ultrasound. A scan of blood circulation in the brain (CT angiogram or MR angiogram). How is this treated? The goal of treatment is to reduce the risk for a stroke. Stroke prevention therapies may  include: Changes to diet and lifestyle, such as being physically active and stopping smoking. Treating other health conditions, such as diabetes or AFib. Medicines to thin the blood (antiplatelets or anticoagulants). Blood pressure medicines. Medicines to reduce cholesterol. If testing shows a narrowing in the arteries to your brain, your health care provider may recommend a procedure, such as: Carotid endarterectomy. This is done to remove the blockage from your artery. Carotid angioplasty and stenting. This uses a small mesh tube (stent) to open or widen an artery in the neck. The stent helps keep the artery open by supporting the artery walls. Follow these instructions at home: Medicines Take over-the-counter and prescription medicines only as told by your health care provider. If you were told to take a medicine to thin your blood, such as aspirin or an anticoagulant, use it exactly as told by your health care provider. Taking too much blood-thinning medicine can cause bleeding. Taking too little will not protect you against a stroke and other problems. Eating and drinking  Eat 5 or more servings of fruits and vegetables each day. Follow guidelines from your health care provider about your diet. You may need to follow a certain diet to help manage risk factors for stroke. This may include: Eating a low-fat, low-salt diet. Choosing high-fiber foods. Limiting carbohydrates and sugar. If you drink alcohol: Limit how much you have to: 0-1 drink a day for women who are not pregnant. 0-2 drinks a day for men. Know how much alcohol is in your drink. In the [place] one drink equals one 12 oz bottle of beer (355 mL), one 5 oz glass of wine (148 mL), or one 1 oz glass of hard liquor (44 mL). General instructions Maintain a healthy weight. Try to get at least 30 minutes of exercise on most days. Get treatment if you have sleep apnea. Do not use any products that contain nicotine or tobacco.  These products include cigarettes, chewing tobacco, and vaping devices, such as e-cigarettes. If you need help quitting, ask your health care provider. Do not use illegal drugs. Keep all follow-up visits. Your health care provider will want to know if you have any more symptoms and to check blood labs if any medicines were prescribed. Where to find more information American Stroke Association: stroke.org Get help right away if: You have chest pain. You have fast or irregular heartbeats (palpitations). You have any symptoms of a stroke. "BE FAST" is an easy way to remember the main warning signs of a stroke. B - Balance. Signs are dizziness, sudden trouble walking, or loss of balance. E - Eyes. Signs are trouble seeing or a sudden change in vision. F - Face. Signs are sudden weakness or numbness of the face, or the face or eyelid drooping on one side. A - Arms. Signs are weakness or numbness in an arm. This happens suddenly and usually on one side of the body. S - Speech.Signs are sudden trouble speaking, slurred speech, or trouble understanding what people say. T - Time. Time to call emergency services. Write down what time symptoms started. You have other signs of a stroke, such as: A sudden, severe headache with no known cause. Nausea or vomiting. Seizure. These symptoms may be an emergency. Get help right  away. Call 911. Do not wait to see if the symptoms will go away. Do not drive yourself to the hospital. This information is not intended to replace advice given to you by your health care provider. Make sure you discuss any questions you have with your health care provider. Document Revised: 01/26/2022 Document Reviewed: 01/26/2022 Elsevier Patient Education  [place].

## 2022-07-23 DIAGNOSIS — M1990 Unspecified osteoarthritis, unspecified site: Secondary | ICD-10-CM | POA: Diagnosis not present

## 2022-07-23 DIAGNOSIS — I10 Essential (primary) hypertension: Secondary | ICD-10-CM | POA: Diagnosis not present

## 2022-07-23 DIAGNOSIS — E785 Hyperlipidemia, unspecified: Secondary | ICD-10-CM | POA: Diagnosis not present

## 2022-07-23 DIAGNOSIS — G43909 Migraine, unspecified, not intractable, without status migrainosus: Secondary | ICD-10-CM | POA: Diagnosis not present

## 2022-07-23 DIAGNOSIS — N401 Enlarged prostate with lower urinary tract symptoms: Secondary | ICD-10-CM | POA: Diagnosis not present

## 2022-07-23 DIAGNOSIS — Z8673 Personal history of transient ischemic attack (TIA), and cerebral infarction without residual deficits: Secondary | ICD-10-CM | POA: Diagnosis not present

## 2022-07-23 DIAGNOSIS — R739 Hyperglycemia, unspecified: Secondary | ICD-10-CM | POA: Diagnosis not present

## 2022-07-23 DIAGNOSIS — K869 Disease of pancreas, unspecified: Secondary | ICD-10-CM | POA: Diagnosis not present

## 2022-07-23 DIAGNOSIS — C4491 Basal cell carcinoma of skin, unspecified: Secondary | ICD-10-CM | POA: Diagnosis not present

## 2022-08-28 ENCOUNTER — Ambulatory Visit: Payer: Medicare PPO | Admitting: Physical Therapy

## 2022-08-29 ENCOUNTER — Ambulatory Visit: Payer: Medicare PPO | Admitting: Orthopaedic Surgery

## 2022-08-29 ENCOUNTER — Encounter: Payer: Self-pay | Admitting: Orthopaedic Surgery

## 2022-08-29 DIAGNOSIS — M542 Cervicalgia: Secondary | ICD-10-CM | POA: Diagnosis not present

## 2022-08-29 DIAGNOSIS — M47812 Spondylosis without myelopathy or radiculopathy, cervical region: Secondary | ICD-10-CM | POA: Diagnosis not present

## 2022-08-29 NOTE — Progress Notes (Signed)
Office Visit Note   Patient: Roy Moody           Date of Birth: [birth year]           MRN: [id number] Visit Date: 08/29/2022              Requested by: Shon Baton, [place],  [place] [id number] PCP: Shon Baton, MD   Assessment & Plan: Visit Diagnoses:  1. Arthritis of neck   2. Neck pain     Plan: Mr. Leverette returns for evaluation of his cervical spine pain.  He has been diagnosed with osteoarthritis and was sent to therapy.  He only had 1 session and then developed a TIA and was hospitalized.  He is now off of blood thinners been approved to return to therapy.  He notes that with prolonged typing the computer or reading a book he will have increased pain but still does not have any discomfort to either upper extremity.  He denies any numbness or tingling.  Prior films do demonstrate degenerative changes.  He does have some stiffness particularly with rotation of his neck to the right and to the left.  Will start another course of physical therapy at [facility] where he resides and have made return for follow-up if needed.  I did have him see Dr. Laurance Flatten if he continues to have a problem.  Might need an MRI scan  Follow-Up Instructions: Return if symptoms worsen or fail to improve.   Orders:  Orders Placed This Encounter  Procedures   Ambulatory referral to Physical Therapy   No orders of the defined types were placed in this encounter.     Procedures: No procedures performed   Clinical Data: No additional findings.   Subjective: Chief Complaint  Patient presents with   Neck - Pain, Follow-up  Returns for evaluation of neck pain with a recent exacerbation after spending time in the computer or reading a book.  In the interim since his last office visit he developed a TIA and was hospitalized.  He was on a "blood thinner" and now is on baby aspirin.  He is having more stiffness with his neck without any referred pain to either upper extremity and  wanted to restart physical therapy  HPI  Review of Systems   Objective: Vital Signs: There were no vitals taken for this visit.  Physical Exam Constitutional:      Appearance: He is well-developed.  Pulmonary:     Effort: Pulmonary effort is normal.  Skin:    General: Skin is warm and dry.  Neurological:     Mental Status: He is alert and oriented to person, place, and time.  Psychiatric:        Behavior: Behavior normal.     Ortho Exam awake alert and oriented x 3.  Comfortable sitting he was able to touch his chin to his chest slowly.  I thought he had a little loss of full neck extension but only about 50% of normal rotation to the right and to the left with soreness in the posterior cervical spine.  No pain referred to either upper extremity.  Good grip and good release.  Specialty Comments:  No specialty comments available.  Imaging: No results found.   PMFS History: Patient Active Problem List   Diagnosis Date Noted   Transient ischemic attack (TIA) 06/11/2022   CVD (cerebrovascular disease) 06/11/2022   Arthritis of neck 05/16/2022   Orthostatic hypotension 12/05/2021   Intractable headache  12/04/2021   Hypertensive urgency 12/03/2021   Vertigo 12/03/2021   Sinus bradycardia 12/03/2021   Overactive bladder 10/27/2021   Pain in limb 10/27/2021   Encounter for screening for other disorder 01/08/2019   Lipoma 01/08/2019   Polyp of colon 07/17/2018   Basal cell carcinoma of skin 07/11/2017   Varicose veins of lower extremity 07/11/2017   Inguinal hernia 12/24/2016   Hematuria 07/03/2016   Glaucoma 12/23/2014   Proteinuria 12/23/2014   Allergic rhinitis 12/19/2012   Underimmunization status 06/24/2012   Macular degeneration 11/30/2010   Disorder of pancreas 11/28/2009   Hyperglycemia 11/28/2009   Benign prostatic hyperplasia with lower urinary tract symptoms 10/25/2009   Dermatitis 10/25/2009   Essential hypertension 10/25/2009   Migraine 10/25/2009    Osteoarthritis 10/25/2009   Migraine, unspecified, not intractable, without status migrainosus 10/25/2009   Past Medical History:  Diagnosis Date   Allergy    contact allergens   Arthritis    Cancer ([place])    skin cancer   Cataract    removed    Glaucoma    History of migraine headaches    Hypertension     Family History  Problem Relation Age of Onset   Colon cancer Father 47   Colon polyps Neg Hx    Esophageal cancer Neg Hx    Rectal cancer Neg Hx    Stomach cancer Neg Hx     Past Surgical History:  Procedure Laterality Date   COLONOSCOPY     PANCREAS SURGERY  2008   POLYPECTOMY     skin cancer removal     several   UPPER GASTROINTESTINAL ENDOSCOPY     VEIN LIGATION Right 2000   leg   Social History   Occupational History   Not on file  Tobacco Use   Smoking status: Never   Smokeless tobacco: Never  Vaping Use   Vaping Use: Never used  Substance and Sexual Activity   Alcohol use: Yes    Alcohol/week: 7.0 standard drinks of alcohol    Types: 7 Glasses of wine per week   Drug use: No   Sexual activity: Not Currently     Garald Balding, MD   Note - This record has been created using [employer].  Chart creation errors have been sought, but may not always  have been located. Such creation errors do not reflect on  the standard of medical care.

## 2022-09-12 DIAGNOSIS — M542 Cervicalgia: Secondary | ICD-10-CM | POA: Diagnosis not present

## 2022-09-12 DIAGNOSIS — M150 Primary generalized (osteo)arthritis: Secondary | ICD-10-CM | POA: Diagnosis not present

## 2022-09-14 DIAGNOSIS — M542 Cervicalgia: Secondary | ICD-10-CM | POA: Diagnosis not present

## 2022-09-14 DIAGNOSIS — M150 Primary generalized (osteo)arthritis: Secondary | ICD-10-CM | POA: Diagnosis not present

## 2022-09-17 DIAGNOSIS — M150 Primary generalized (osteo)arthritis: Secondary | ICD-10-CM | POA: Diagnosis not present

## 2022-09-17 DIAGNOSIS — M542 Cervicalgia: Secondary | ICD-10-CM | POA: Diagnosis not present

## 2022-09-19 DIAGNOSIS — M150 Primary generalized (osteo)arthritis: Secondary | ICD-10-CM | POA: Diagnosis not present

## 2022-09-19 DIAGNOSIS — M542 Cervicalgia: Secondary | ICD-10-CM | POA: Diagnosis not present

## 2022-09-24 DIAGNOSIS — M150 Primary generalized (osteo)arthritis: Secondary | ICD-10-CM | POA: Diagnosis not present

## 2022-09-24 DIAGNOSIS — M542 Cervicalgia: Secondary | ICD-10-CM | POA: Diagnosis not present

## 2022-09-26 DIAGNOSIS — M150 Primary generalized (osteo)arthritis: Secondary | ICD-10-CM | POA: Diagnosis not present

## 2022-09-26 DIAGNOSIS — M542 Cervicalgia: Secondary | ICD-10-CM | POA: Diagnosis not present

## 2022-10-01 DIAGNOSIS — M150 Primary generalized (osteo)arthritis: Secondary | ICD-10-CM | POA: Diagnosis not present

## 2022-10-01 DIAGNOSIS — M542 Cervicalgia: Secondary | ICD-10-CM | POA: Diagnosis not present

## 2022-10-03 DIAGNOSIS — M542 Cervicalgia: Secondary | ICD-10-CM | POA: Diagnosis not present

## 2022-10-03 DIAGNOSIS — M150 Primary generalized (osteo)arthritis: Secondary | ICD-10-CM | POA: Diagnosis not present

## 2022-10-05 DIAGNOSIS — M542 Cervicalgia: Secondary | ICD-10-CM | POA: Diagnosis not present

## 2022-10-05 DIAGNOSIS — M150 Primary generalized (osteo)arthritis: Secondary | ICD-10-CM | POA: Diagnosis not present

## 2022-10-16 DIAGNOSIS — D2271 Melanocytic nevi of right lower limb, including hip: Secondary | ICD-10-CM | POA: Diagnosis not present

## 2022-10-16 DIAGNOSIS — L720 Epidermal cyst: Secondary | ICD-10-CM | POA: Diagnosis not present

## 2022-10-16 DIAGNOSIS — L738 Other specified follicular disorders: Secondary | ICD-10-CM | POA: Diagnosis not present

## 2022-10-16 DIAGNOSIS — Z85828 Personal history of other malignant neoplasm of skin: Secondary | ICD-10-CM | POA: Diagnosis not present

## 2022-10-16 DIAGNOSIS — D2272 Melanocytic nevi of left lower limb, including hip: Secondary | ICD-10-CM | POA: Diagnosis not present

## 2022-10-16 DIAGNOSIS — L821 Other seborrheic keratosis: Secondary | ICD-10-CM | POA: Diagnosis not present

## 2022-10-16 DIAGNOSIS — L570 Actinic keratosis: Secondary | ICD-10-CM | POA: Diagnosis not present

## 2022-10-18 DIAGNOSIS — H0102B Squamous blepharitis left eye, upper and lower eyelids: Secondary | ICD-10-CM | POA: Diagnosis not present

## 2022-10-18 DIAGNOSIS — H401113 Primary open-angle glaucoma, right eye, severe stage: Secondary | ICD-10-CM | POA: Diagnosis not present

## 2022-10-18 DIAGNOSIS — H401121 Primary open-angle glaucoma, left eye, mild stage: Secondary | ICD-10-CM | POA: Diagnosis not present

## 2022-10-18 DIAGNOSIS — H5021 Vertical strabismus, right eye: Secondary | ICD-10-CM | POA: Diagnosis not present

## 2022-10-18 DIAGNOSIS — H2512 Age-related nuclear cataract, left eye: Secondary | ICD-10-CM | POA: Diagnosis not present

## 2022-10-18 DIAGNOSIS — Z9889 Other specified postprocedural states: Secondary | ICD-10-CM | POA: Diagnosis not present

## 2022-10-18 DIAGNOSIS — H0102A Squamous blepharitis right eye, upper and lower eyelids: Secondary | ICD-10-CM | POA: Diagnosis not present

## 2022-10-18 DIAGNOSIS — Z961 Presence of intraocular lens: Secondary | ICD-10-CM | POA: Diagnosis not present

## 2022-11-15 ENCOUNTER — Other Ambulatory Visit (HOSPITAL_BASED_OUTPATIENT_CLINIC_OR_DEPARTMENT_OTHER): Payer: Self-pay

## 2022-11-15 MED ORDER — COMIRNATY 30 MCG/0.3ML IM SUSY
PREFILLED_SYRINGE | INTRAMUSCULAR | 0 refills | Status: AC
  Filled 2022-11-15: qty 0.3, 1d supply, fill #0

## 2023-01-15 DIAGNOSIS — E785 Hyperlipidemia, unspecified: Secondary | ICD-10-CM | POA: Diagnosis not present

## 2023-01-15 DIAGNOSIS — Z0000 Encounter for general adult medical examination without abnormal findings: Secondary | ICD-10-CM | POA: Diagnosis not present

## 2023-01-15 DIAGNOSIS — R739 Hyperglycemia, unspecified: Secondary | ICD-10-CM | POA: Diagnosis not present

## 2023-01-15 DIAGNOSIS — Z125 Encounter for screening for malignant neoplasm of prostate: Secondary | ICD-10-CM | POA: Diagnosis not present

## 2023-01-15 DIAGNOSIS — I10 Essential (primary) hypertension: Secondary | ICD-10-CM | POA: Diagnosis not present

## 2023-01-22 DIAGNOSIS — R82998 Other abnormal findings in urine: Secondary | ICD-10-CM | POA: Diagnosis not present

## 2023-01-22 DIAGNOSIS — I10 Essential (primary) hypertension: Secondary | ICD-10-CM | POA: Diagnosis not present

## 2023-01-22 DIAGNOSIS — C4491 Basal cell carcinoma of skin, unspecified: Secondary | ICD-10-CM | POA: Diagnosis not present

## 2023-01-22 DIAGNOSIS — E785 Hyperlipidemia, unspecified: Secondary | ICD-10-CM | POA: Diagnosis not present

## 2023-01-22 DIAGNOSIS — Z1331 Encounter for screening for depression: Secondary | ICD-10-CM | POA: Diagnosis not present

## 2023-01-22 DIAGNOSIS — M1990 Unspecified osteoarthritis, unspecified site: Secondary | ICD-10-CM | POA: Diagnosis not present

## 2023-01-22 DIAGNOSIS — Z1389 Encounter for screening for other disorder: Secondary | ICD-10-CM | POA: Diagnosis not present

## 2023-01-22 DIAGNOSIS — R4789 Other speech disturbances: Secondary | ICD-10-CM | POA: Diagnosis not present

## 2023-01-22 DIAGNOSIS — Z0000 Encounter for general adult medical examination without abnormal findings: Secondary | ICD-10-CM | POA: Diagnosis not present

## 2023-01-22 DIAGNOSIS — Z8673 Personal history of transient ischemic attack (TIA), and cerebral infarction without residual deficits: Secondary | ICD-10-CM | POA: Diagnosis not present

## 2023-01-22 DIAGNOSIS — B351 Tinea unguium: Secondary | ICD-10-CM | POA: Diagnosis not present

## 2023-01-22 DIAGNOSIS — N401 Enlarged prostate with lower urinary tract symptoms: Secondary | ICD-10-CM | POA: Diagnosis not present

## 2023-04-22 DIAGNOSIS — L718 Other rosacea: Secondary | ICD-10-CM | POA: Diagnosis not present

## 2023-04-22 DIAGNOSIS — Z85828 Personal history of other malignant neoplasm of skin: Secondary | ICD-10-CM | POA: Diagnosis not present

## 2023-04-22 DIAGNOSIS — L570 Actinic keratosis: Secondary | ICD-10-CM | POA: Diagnosis not present

## 2023-04-22 DIAGNOSIS — D225 Melanocytic nevi of trunk: Secondary | ICD-10-CM | POA: Diagnosis not present

## 2023-04-22 DIAGNOSIS — L308 Other specified dermatitis: Secondary | ICD-10-CM | POA: Diagnosis not present

## 2023-04-22 DIAGNOSIS — L821 Other seborrheic keratosis: Secondary | ICD-10-CM | POA: Diagnosis not present

## 2023-04-22 DIAGNOSIS — D692 Other nonthrombocytopenic purpura: Secondary | ICD-10-CM | POA: Diagnosis not present

## 2023-04-22 DIAGNOSIS — L218 Other seborrheic dermatitis: Secondary | ICD-10-CM | POA: Diagnosis not present

## 2023-04-30 DIAGNOSIS — H0102B Squamous blepharitis left eye, upper and lower eyelids: Secondary | ICD-10-CM | POA: Diagnosis not present

## 2023-04-30 DIAGNOSIS — H0102A Squamous blepharitis right eye, upper and lower eyelids: Secondary | ICD-10-CM | POA: Diagnosis not present

## 2023-04-30 DIAGNOSIS — H2512 Age-related nuclear cataract, left eye: Secondary | ICD-10-CM | POA: Diagnosis not present

## 2023-04-30 DIAGNOSIS — Z961 Presence of intraocular lens: Secondary | ICD-10-CM | POA: Diagnosis not present

## 2023-04-30 DIAGNOSIS — H401121 Primary open-angle glaucoma, left eye, mild stage: Secondary | ICD-10-CM | POA: Diagnosis not present

## 2023-04-30 DIAGNOSIS — H401113 Primary open-angle glaucoma, right eye, severe stage: Secondary | ICD-10-CM | POA: Diagnosis not present

## 2023-05-07 ENCOUNTER — Other Ambulatory Visit (HOSPITAL_BASED_OUTPATIENT_CLINIC_OR_DEPARTMENT_OTHER): Payer: Self-pay

## 2023-05-07 MED ORDER — FLUAD 0.5 ML IM SUSY
0.5000 mL | PREFILLED_SYRINGE | Freq: Once | INTRAMUSCULAR | 0 refills | Status: AC
  Filled 2023-05-07: qty 0.5, 1d supply, fill #0

## 2023-07-22 DIAGNOSIS — B351 Tinea unguium: Secondary | ICD-10-CM | POA: Diagnosis not present

## 2023-07-22 DIAGNOSIS — G43909 Migraine, unspecified, not intractable, without status migrainosus: Secondary | ICD-10-CM | POA: Diagnosis not present

## 2023-07-22 DIAGNOSIS — K869 Disease of pancreas, unspecified: Secondary | ICD-10-CM | POA: Diagnosis not present

## 2023-07-22 DIAGNOSIS — R739 Hyperglycemia, unspecified: Secondary | ICD-10-CM | POA: Diagnosis not present

## 2023-07-22 DIAGNOSIS — M1990 Unspecified osteoarthritis, unspecified site: Secondary | ICD-10-CM | POA: Diagnosis not present

## 2023-07-22 DIAGNOSIS — I10 Essential (primary) hypertension: Secondary | ICD-10-CM | POA: Diagnosis not present

## 2023-07-22 DIAGNOSIS — N401 Enlarged prostate with lower urinary tract symptoms: Secondary | ICD-10-CM | POA: Diagnosis not present

## 2023-07-22 DIAGNOSIS — C4491 Basal cell carcinoma of skin, unspecified: Secondary | ICD-10-CM | POA: Diagnosis not present

## 2023-07-22 DIAGNOSIS — E785 Hyperlipidemia, unspecified: Secondary | ICD-10-CM | POA: Diagnosis not present

## 2023-10-16 DIAGNOSIS — L218 Other seborrheic dermatitis: Secondary | ICD-10-CM | POA: Diagnosis not present

## 2023-10-16 DIAGNOSIS — D225 Melanocytic nevi of trunk: Secondary | ICD-10-CM | POA: Diagnosis not present

## 2023-10-16 DIAGNOSIS — D485 Neoplasm of uncertain behavior of skin: Secondary | ICD-10-CM | POA: Diagnosis not present

## 2023-10-16 DIAGNOSIS — L905 Scar conditions and fibrosis of skin: Secondary | ICD-10-CM | POA: Diagnosis not present

## 2023-10-16 DIAGNOSIS — L309 Dermatitis, unspecified: Secondary | ICD-10-CM | POA: Diagnosis not present

## 2023-10-16 DIAGNOSIS — Z85828 Personal history of other malignant neoplasm of skin: Secondary | ICD-10-CM | POA: Diagnosis not present

## 2023-10-16 DIAGNOSIS — L570 Actinic keratosis: Secondary | ICD-10-CM | POA: Diagnosis not present

## 2023-10-16 DIAGNOSIS — D044 Carcinoma in situ of skin of scalp and neck: Secondary | ICD-10-CM | POA: Diagnosis not present

## 2023-10-16 DIAGNOSIS — L821 Other seborrheic keratosis: Secondary | ICD-10-CM | POA: Diagnosis not present

## 2023-11-05 DIAGNOSIS — H401113 Primary open-angle glaucoma, right eye, severe stage: Secondary | ICD-10-CM | POA: Diagnosis not present

## 2023-11-05 DIAGNOSIS — Z961 Presence of intraocular lens: Secondary | ICD-10-CM | POA: Diagnosis not present

## 2023-11-05 DIAGNOSIS — H0102A Squamous blepharitis right eye, upper and lower eyelids: Secondary | ICD-10-CM | POA: Diagnosis not present

## 2023-11-05 DIAGNOSIS — H0102B Squamous blepharitis left eye, upper and lower eyelids: Secondary | ICD-10-CM | POA: Diagnosis not present

## 2023-11-05 DIAGNOSIS — H2512 Age-related nuclear cataract, left eye: Secondary | ICD-10-CM | POA: Diagnosis not present

## 2023-11-05 DIAGNOSIS — H401121 Primary open-angle glaucoma, left eye, mild stage: Secondary | ICD-10-CM | POA: Diagnosis not present

## 2023-12-24 IMAGING — CT CT HEAD W/O CM
4 series · 16 of 47 positions shown, 18 images · non-contrast
Comparison: None.

CLINICAL DATA: Vertigo.  Hypertension.



[Series 2: head wo · axial · 0.47mm/px · z∈[+875,+990]mm · 7 of 31 slices shown, 9 images]
[im 4/31  brain]
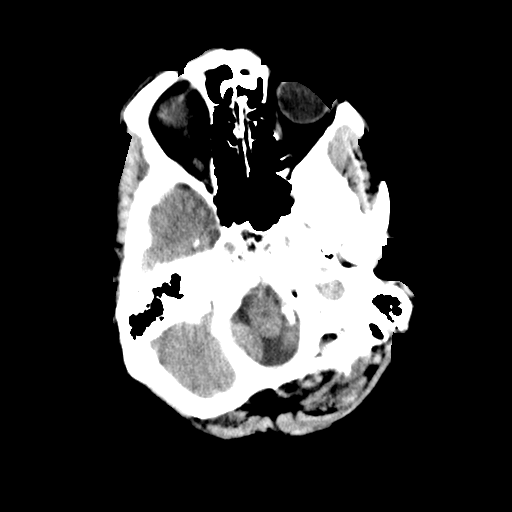
[im 4/31  bone]
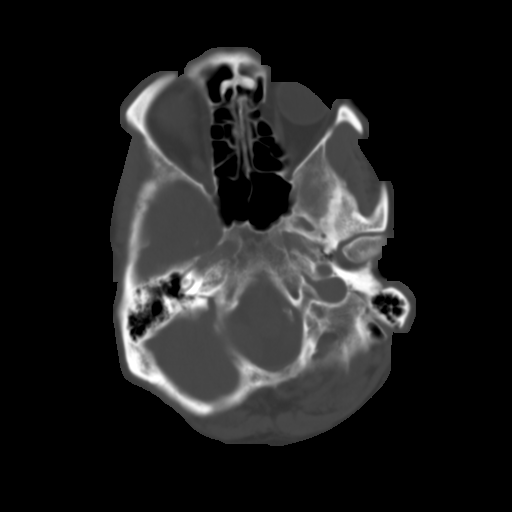
[im 8/31  brain]
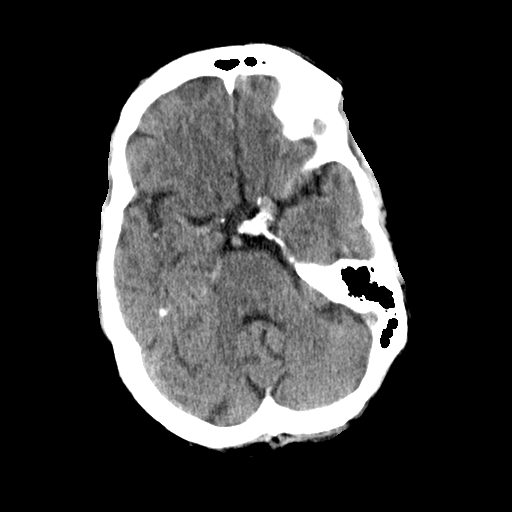
[im 12/31  brain]
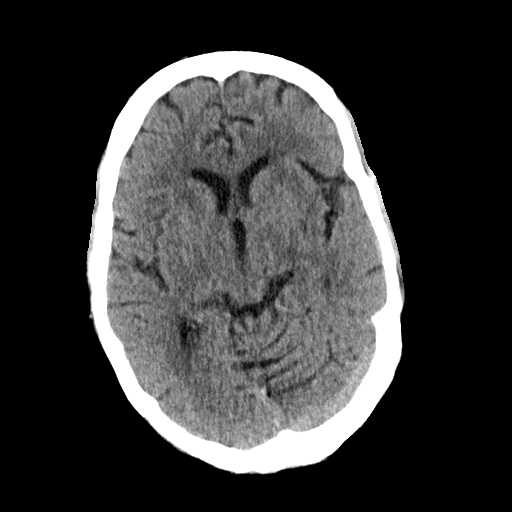
[im 16/31  brain]
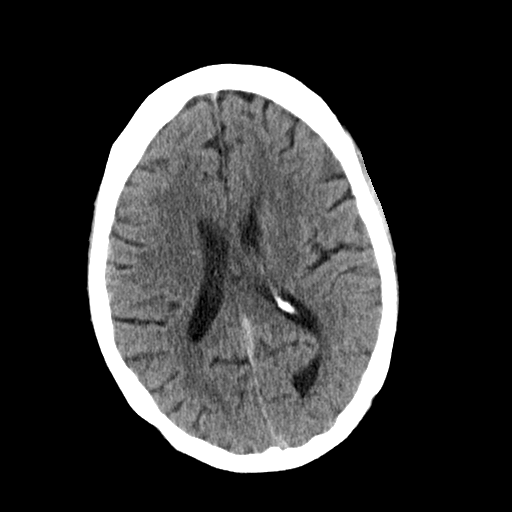
[im 19/31  brain]
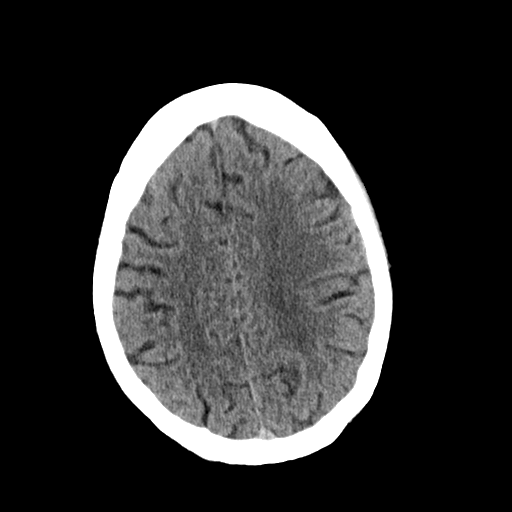
[im 19/31  bone]
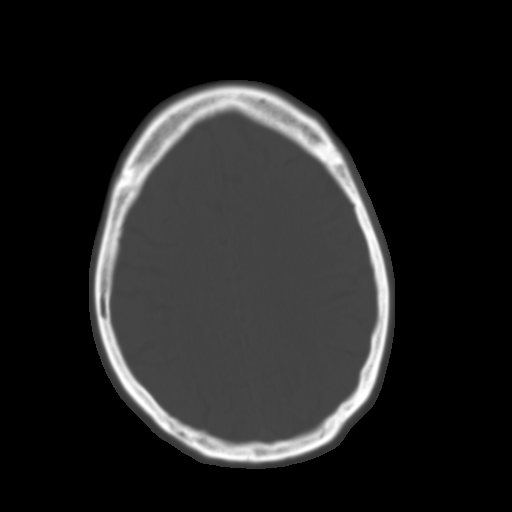
[im 23/31  brain]
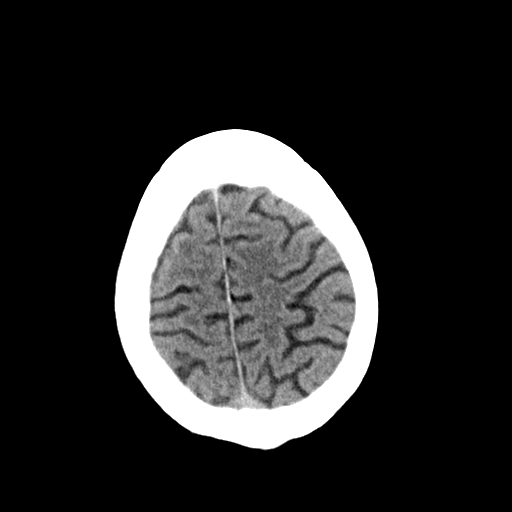
[im 27/31  brain]
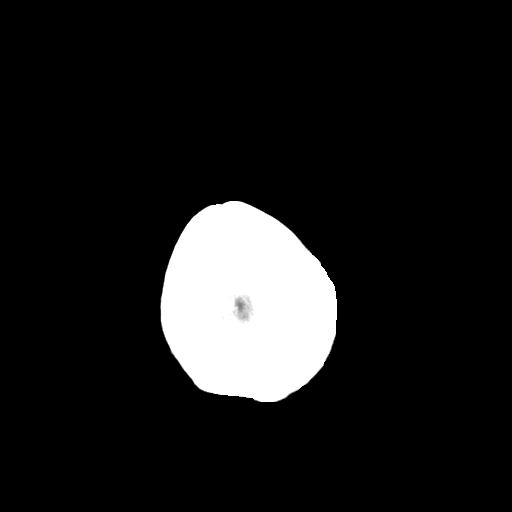

[Series 3: head bone · axial · 0.47mm/px · z∈[+874,+906]mm · 3 of 78 slices shown]
[im 8/78  bone]
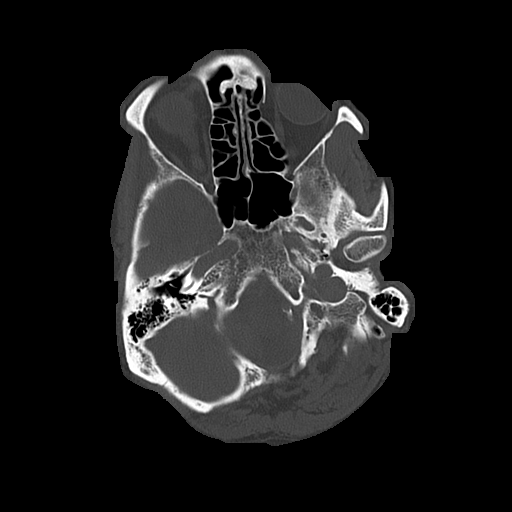
[im 16/78  bone]
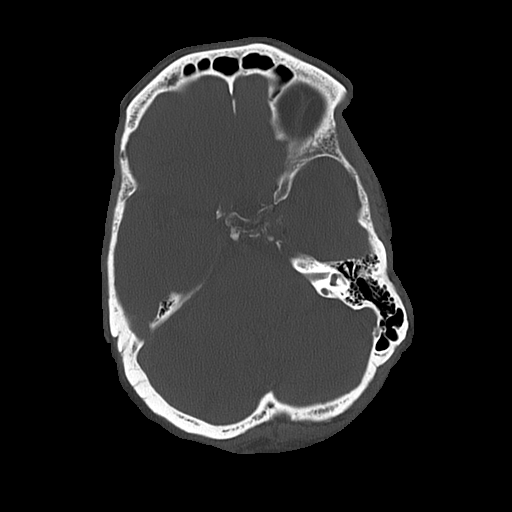
[im 24/78  bone]
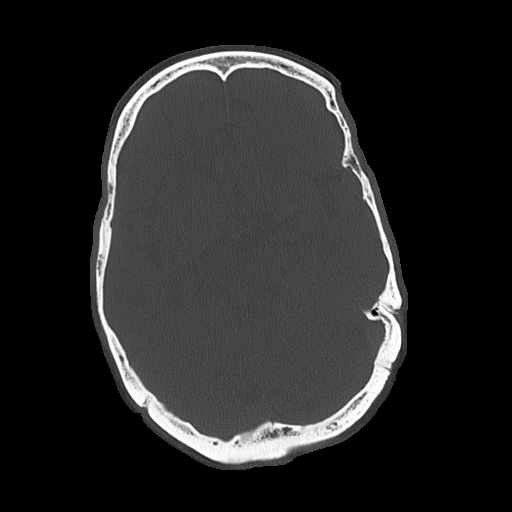

[Series 4: coronal soft · coronal · 0.33mm/px · 3 of 70 slices shown]
[im 24/70  brain]
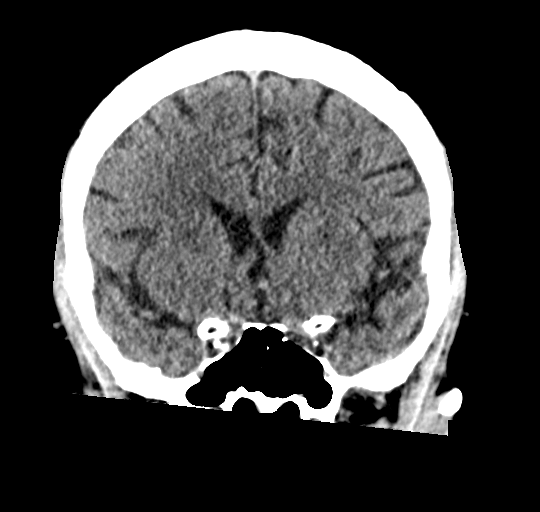
[im 31/70  brain]
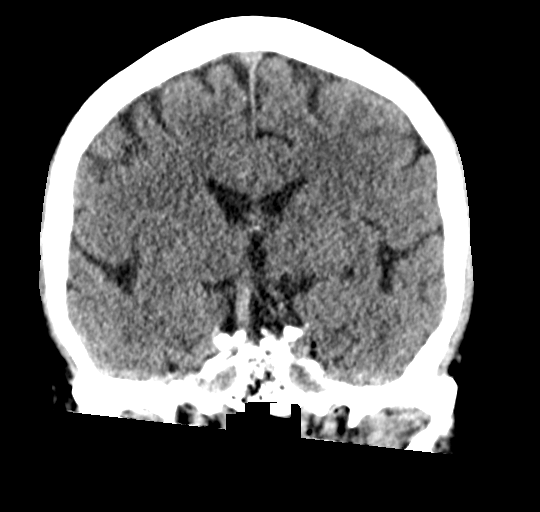
[im 39/70  brain]
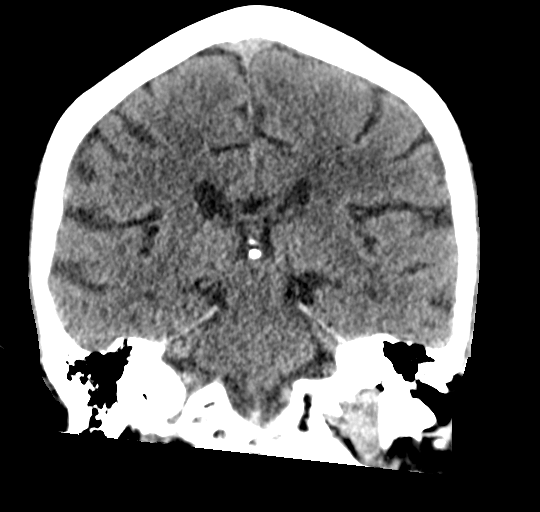

[Series 5: sagittal soft · sagittal · 0.33mm/px · 3 of 54 slices shown]
[im 19/54  brain]
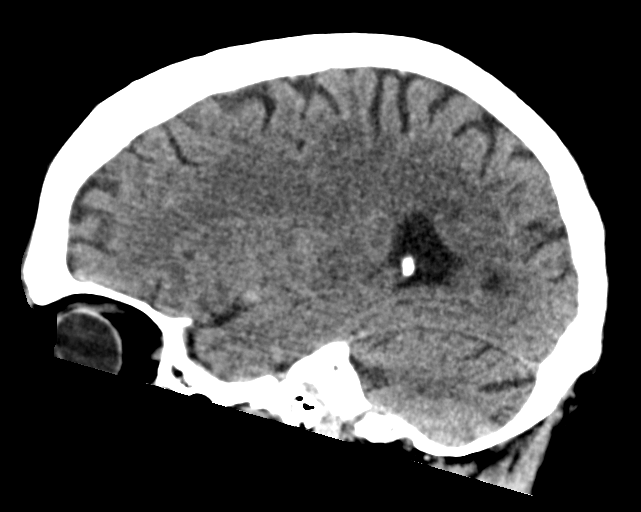
[im 27/54  brain]
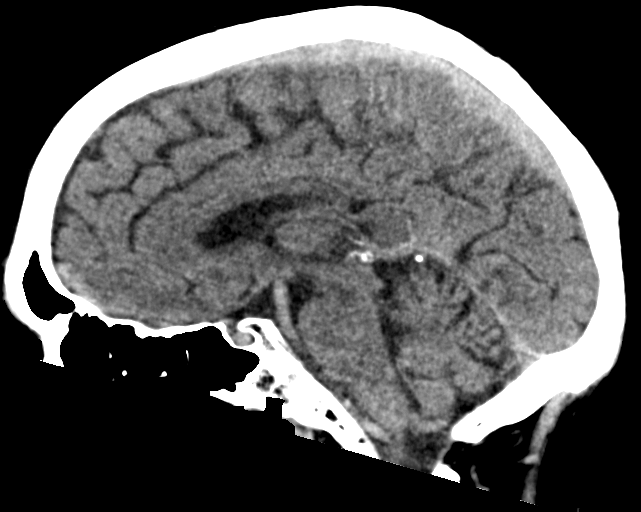
[im 36/54  brain]
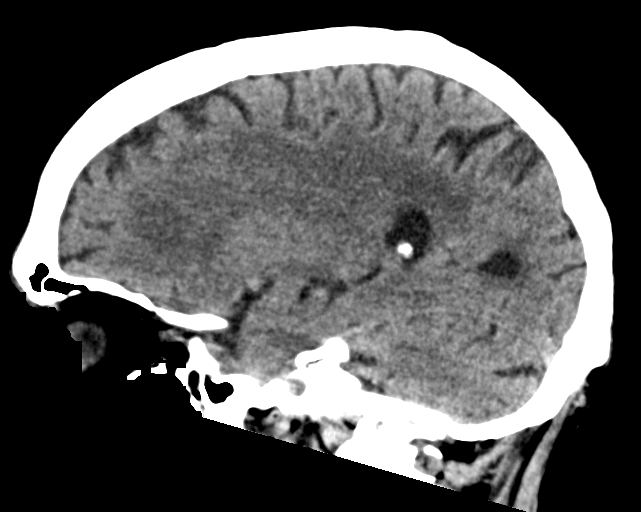

[16 of 47 positions shown; findings below may reference images not displayed]

FINDINGS: Brain: There is no evidence for acute hemorrhage, hydrocephalus,
mass lesion, or abnormal extra-axial fluid collection. No definite
CT evidence for acute infarction.

Vascular: No hyperdense vessel or unexpected calcification.

Skull: No evidence for fracture. No worrisome lytic or sclerotic
lesion.

Sinuses/Orbits: The visualized paranasal sinuses and mastoid air
cells are clear. Visualized portions of the globes and intraorbital
fat are unremarkable.

Other: None.
IMPRESSION: No acute intracranial abnormality.

## 2024-01-21 DIAGNOSIS — Z79899 Other long term (current) drug therapy: Secondary | ICD-10-CM | POA: Diagnosis not present

## 2024-01-21 DIAGNOSIS — R739 Hyperglycemia, unspecified: Secondary | ICD-10-CM | POA: Diagnosis not present

## 2024-01-21 DIAGNOSIS — I10 Essential (primary) hypertension: Secondary | ICD-10-CM | POA: Diagnosis not present

## 2024-01-21 DIAGNOSIS — N401 Enlarged prostate with lower urinary tract symptoms: Secondary | ICD-10-CM | POA: Diagnosis not present

## 2024-01-21 DIAGNOSIS — E785 Hyperlipidemia, unspecified: Secondary | ICD-10-CM | POA: Diagnosis not present

## 2024-01-27 DIAGNOSIS — M1990 Unspecified osteoarthritis, unspecified site: Secondary | ICD-10-CM | POA: Diagnosis not present

## 2024-01-27 DIAGNOSIS — B351 Tinea unguium: Secondary | ICD-10-CM | POA: Diagnosis not present

## 2024-01-27 DIAGNOSIS — Z0000 Encounter for general adult medical examination without abnormal findings: Secondary | ICD-10-CM | POA: Diagnosis not present

## 2024-01-27 DIAGNOSIS — I10 Essential (primary) hypertension: Secondary | ICD-10-CM | POA: Diagnosis not present

## 2024-01-27 DIAGNOSIS — Z23 Encounter for immunization: Secondary | ICD-10-CM | POA: Diagnosis not present

## 2024-01-27 DIAGNOSIS — Z1389 Encounter for screening for other disorder: Secondary | ICD-10-CM | POA: Diagnosis not present

## 2024-01-27 DIAGNOSIS — R4789 Other speech disturbances: Secondary | ICD-10-CM | POA: Diagnosis not present

## 2024-01-27 DIAGNOSIS — C4491 Basal cell carcinoma of skin, unspecified: Secondary | ICD-10-CM | POA: Diagnosis not present

## 2024-01-27 DIAGNOSIS — E785 Hyperlipidemia, unspecified: Secondary | ICD-10-CM | POA: Diagnosis not present

## 2024-01-27 DIAGNOSIS — Z1331 Encounter for screening for depression: Secondary | ICD-10-CM | POA: Diagnosis not present

## 2024-01-27 DIAGNOSIS — N3281 Overactive bladder: Secondary | ICD-10-CM | POA: Diagnosis not present

## 2024-04-15 DIAGNOSIS — L570 Actinic keratosis: Secondary | ICD-10-CM | POA: Diagnosis not present

## 2024-04-15 DIAGNOSIS — L821 Other seborrheic keratosis: Secondary | ICD-10-CM | POA: Diagnosis not present

## 2024-04-15 DIAGNOSIS — D2272 Melanocytic nevi of left lower limb, including hip: Secondary | ICD-10-CM | POA: Diagnosis not present

## 2024-04-15 DIAGNOSIS — Z85828 Personal history of other malignant neoplasm of skin: Secondary | ICD-10-CM | POA: Diagnosis not present

## 2024-04-17 DIAGNOSIS — R051 Acute cough: Secondary | ICD-10-CM | POA: Diagnosis not present

## 2024-04-17 DIAGNOSIS — I10 Essential (primary) hypertension: Secondary | ICD-10-CM | POA: Diagnosis not present

## 2024-04-17 DIAGNOSIS — J069 Acute upper respiratory infection, unspecified: Secondary | ICD-10-CM | POA: Diagnosis not present

## 2024-04-17 DIAGNOSIS — J309 Allergic rhinitis, unspecified: Secondary | ICD-10-CM | POA: Diagnosis not present

## 2024-05-20 DIAGNOSIS — H0102A Squamous blepharitis right eye, upper and lower eyelids: Secondary | ICD-10-CM | POA: Diagnosis not present

## 2024-05-20 DIAGNOSIS — H401113 Primary open-angle glaucoma, right eye, severe stage: Secondary | ICD-10-CM | POA: Diagnosis not present

## 2024-05-20 DIAGNOSIS — H0102B Squamous blepharitis left eye, upper and lower eyelids: Secondary | ICD-10-CM | POA: Diagnosis not present

## 2024-05-20 DIAGNOSIS — H401121 Primary open-angle glaucoma, left eye, mild stage: Secondary | ICD-10-CM | POA: Diagnosis not present

## 2024-05-20 DIAGNOSIS — H2512 Age-related nuclear cataract, left eye: Secondary | ICD-10-CM | POA: Diagnosis not present

## 2024-05-22 ENCOUNTER — Other Ambulatory Visit (HOSPITAL_BASED_OUTPATIENT_CLINIC_OR_DEPARTMENT_OTHER): Payer: Self-pay

## 2024-05-22 MED ORDER — COMIRNATY 30 MCG/0.3ML IM SUSY
0.3000 mL | PREFILLED_SYRINGE | Freq: Once | INTRAMUSCULAR | 0 refills | Status: AC
  Filled 2024-05-22: qty 0.3, 1d supply, fill #0

## 2024-06-18 ENCOUNTER — Other Ambulatory Visit (HOSPITAL_BASED_OUTPATIENT_CLINIC_OR_DEPARTMENT_OTHER): Payer: Self-pay

## 2024-06-18 MED ORDER — FLUZONE HIGH-DOSE 0.5 ML IM SUSY
0.5000 mL | PREFILLED_SYRINGE | Freq: Once | INTRAMUSCULAR | 0 refills | Status: AC
  Filled 2024-06-18: qty 0.5, 1d supply, fill #0
